# Patient Record
Sex: Male | Born: 1960 | Race: Black or African American | Hispanic: No | Marital: Married | Smoking: Former smoker
Health system: Southern US, Community
[De-identification: ages and names within clinical notes are randomized; demographics above are authoritative.]

## PROBLEM LIST (undated history)

## (undated) DIAGNOSIS — I10 Essential (primary) hypertension: Secondary | ICD-10-CM

## (undated) DIAGNOSIS — M1990 Unspecified osteoarthritis, unspecified site: Secondary | ICD-10-CM

## (undated) HISTORY — PX: HERNIA REPAIR: SHX51

## (undated) HISTORY — PX: OTHER SURGICAL HISTORY: SHX169

## (undated) HISTORY — PX: KNEE ARTHROSCOPY WITH MENISCAL REPAIR: SHX5653

---

## 2015-12-20 ENCOUNTER — Telehealth (HOSPITAL_COMMUNITY): Payer: Self-pay | Admitting: *Deleted

## 2015-12-20 ENCOUNTER — Ambulatory Visit (HOSPITAL_COMMUNITY)
Admission: EM | Admit: 2015-12-20 | Discharge: 2015-12-20 | Disposition: A | Discharge status: Home / Self Care | Payer: Worker's Compensation | Attending: Family Medicine | Admitting: Family Medicine

## 2015-12-20 ENCOUNTER — Ambulatory Visit (HOSPITAL_COMMUNITY): Payer: Worker's Compensation

## 2015-12-20 ENCOUNTER — Encounter (HOSPITAL_COMMUNITY): Payer: Self-pay | Admitting: *Deleted

## 2015-12-20 DIAGNOSIS — S46911A Strain of unspecified muscle, fascia and tendon at shoulder and upper arm level, right arm, initial encounter: Secondary | ICD-10-CM

## 2015-12-20 DIAGNOSIS — M25521 Pain in right elbow: Secondary | ICD-10-CM | POA: Insufficient documentation

## 2015-12-20 DIAGNOSIS — S56911A Strain of unspecified muscles, fascia and tendons at forearm level, right arm, initial encounter: Secondary | ICD-10-CM

## 2015-12-20 MED ORDER — IBUPROFEN 800 MG PO TABS
800.0000 mg | ORAL_TABLET | Freq: Once | ORAL | Status: AC
  Administered 2015-12-20: 800 mg via ORAL

## 2015-12-20 MED ORDER — IBUPROFEN 800 MG PO TABS
ORAL_TABLET | ORAL | Status: AC
Start: 2015-12-20 — End: 2015-12-20
  Filled 2015-12-20: qty 1

## 2015-12-20 MED ORDER — DICLOFENAC POTASSIUM 50 MG PO TABS: 50.0000 mg | ORAL_TABLET | Freq: Three times a day (TID) | ORAL | Status: DC

## 2015-12-20 NOTE — Discharge Instructions (Signed)
Ice pack, sling and medicine as needed, see orthopedist if further problems

## 2015-12-20 NOTE — ED Notes (Signed)
Reports sudden onset right arm pain while carrying an airplane seat at work 11/28/15.  Continues with pain.  Has not taken any measures to help alleviate pain.

## 2015-12-20 NOTE — ED Notes (Signed)
Pt transferred to main hospital XR dept.

## 2015-12-20 NOTE — ED Notes (Signed)
Awaiting for shuttle return to transport pt for XR.

## 2015-12-20 NOTE — ED Provider Notes (Signed)
CSN: [id number]     Arrival date & time 12/20/15  1402 History   First MD Initiated Contact with Patient 12/20/15 1430     Chief Complaint  Patient presents with  . Arm Pain   (Consider location/radiation/quality/duration/timing/severity/associated sxs/prior Treatment) Patient is a 55 y.o. male presenting with arm injury. The history is provided by the patient.  Arm Injury Location:  Elbow Time since incident:  3 weeks Injury: yes   Mechanism of injury comment:  Carrying an airplane chair backward and suddenly felt medial right elbow pain for 3 wks now with certain positions. Elbow location:  R elbow Pain details:    Quality:  Sharp   Severity:  Mild   Onset quality:  Sudden Chronicity:  New Handedness:  Right-handed Dislocation: no   Foreign body present:  No foreign bodies Associated symptoms: no neck pain     History reviewed. No pertinent past medical history. Past Surgical History  Procedure Laterality Date  . Hernia repair    . Knee arthroscopy with meniscal repair    . Right neck/wrist/hand     No family history on file. Social History  Substance Use Topics  . Smoking status: Former Games developermoker  . Smokeless tobacco: None  . Alcohol Use: Yes     Comment: occasional    Review of Systems  Constitutional: Negative.   Musculoskeletal: Positive for myalgias. Negative for joint swelling, gait problem and neck pain.  Skin: Negative.   All other systems reviewed and are negative.   Allergies  Review of patient's allergies indicates no known allergies.  Home Medications   Prior to Admission medications   Medication Sig Start Date End Date Taking? Authorizing Provider  UNKNOWN TO PATIENT OTC supplements/vitamins   Yes Historical Provider, MD  diclofenac (CATAFLAM) 50 MG tablet Take 1 tablet (50 mg total) by mouth 3 (three) times daily. 12/20/15   Linna HoffJames D Nicolina Hirt, MD   Meds Ordered and Administered this Visit   Medications  ibuprofen (ADVIL,MOTRIN) tablet 800 mg (800 mg  Oral Given 12/20/15 1625)    BP 144/93 mmHg  Pulse 69  Temp(Src) 98.2 F (36.8 C) (Oral)  SpO2 96% No data found.   Physical Exam  Constitutional: He is oriented to person, place, and time. He appears well-developed and well-nourished.  Musculoskeletal: He exhibits tenderness.       Right elbow: He exhibits normal range of motion. Tenderness found. Medial epicondyle tenderness noted.       Arms: Neurological: He is alert and oriented to person, place, and time.  Skin: Skin is warm and dry.  Nursing note and vitals reviewed.   ED Course  Procedures (including critical care time)  Labs Review Labs Reviewed - No data to display  Imaging Review Dg Elbow Complete Right  12/20/2015  CLINICAL DATA:  Right elbow pain following injury 1 month ago. Initial encounter. EXAM: RIGHT ELBOW - COMPLETE 3+ VIEW COMPARISON:  None. FINDINGS: There is no evidence of fracture, dislocation, or joint effusion. There is no evidence of arthropathy or other focal bone abnormality. Soft tissues are unremarkable. IMPRESSION: Negative. Electronically Signed   By: Harmon PierJeffrey  Hu M.D.   On: 12/20/2015 16:26     Visual Acuity Review  Right Eye Distance:   Left Eye Distance:   Bilateral Distance:    Right Eye Near:   Left Eye Near:    Bilateral Near:         MDM   1. Elbow strain, right, initial encounter  Linna Hoff, MD 12/20/15 [phone]

## 2017-04-23 IMAGING — DX DG ELBOW COMPLETE 3+V*R*
4 series · 4 of 4 positions shown · non-contrast
Comparison: None.

CLINICAL DATA: Right elbow pain following injury 1 month ago.
Initial encounter.

EXAM:
RIGHT ELBOW - COMPLETE 3+ VIEW

[x elbow obl right (1 of 2)]
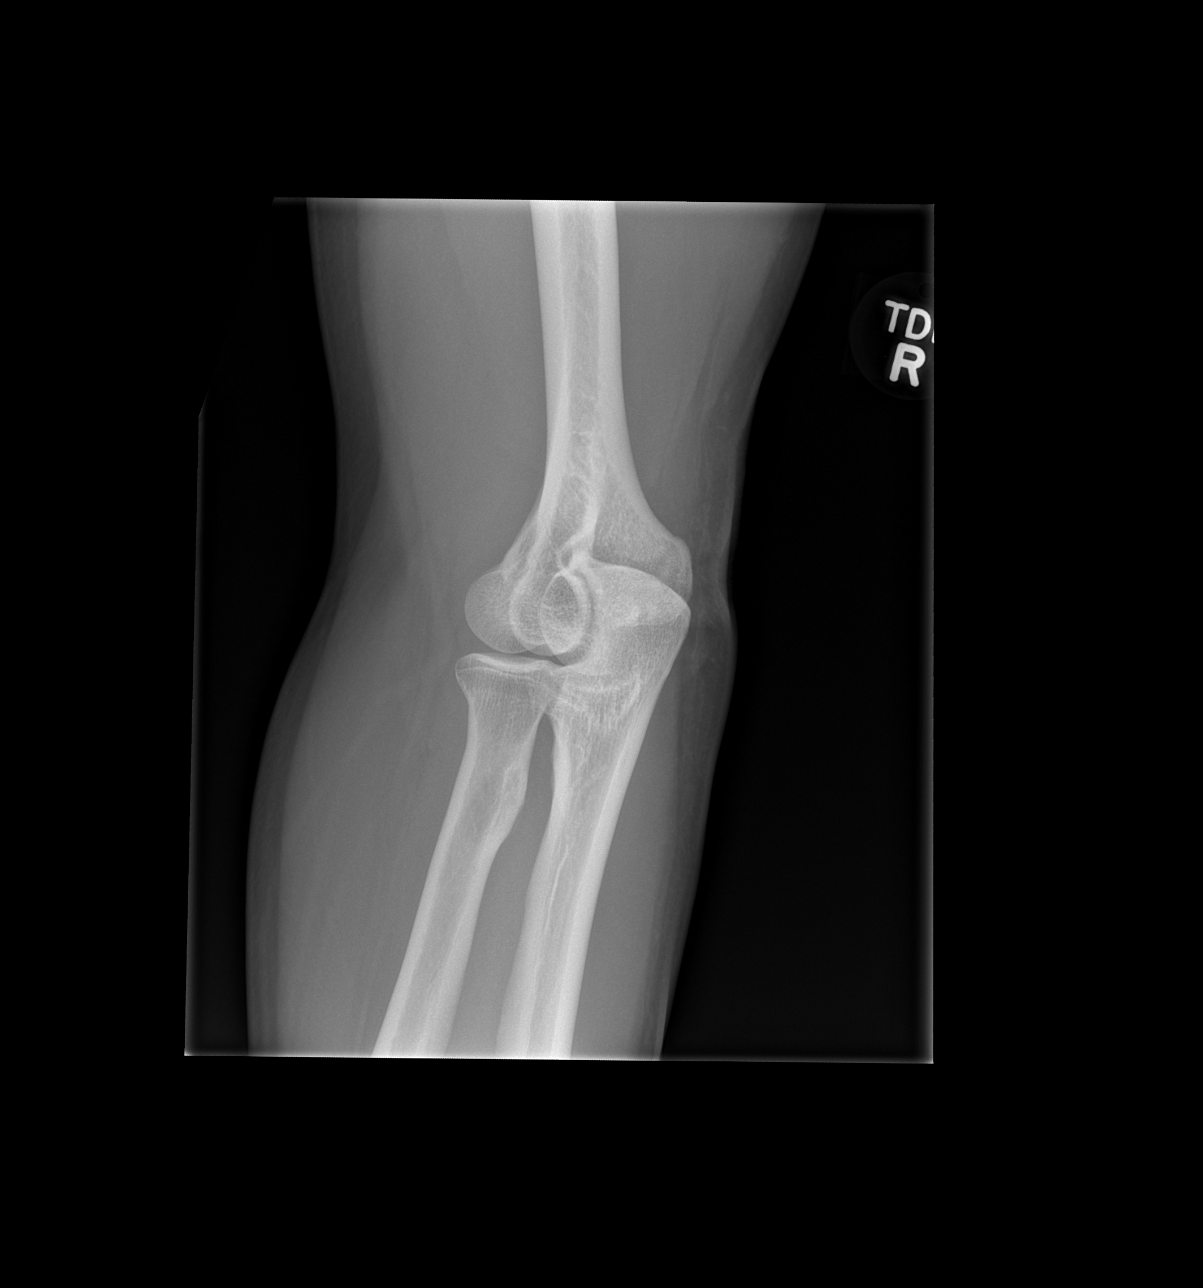

[x elbow ap right]
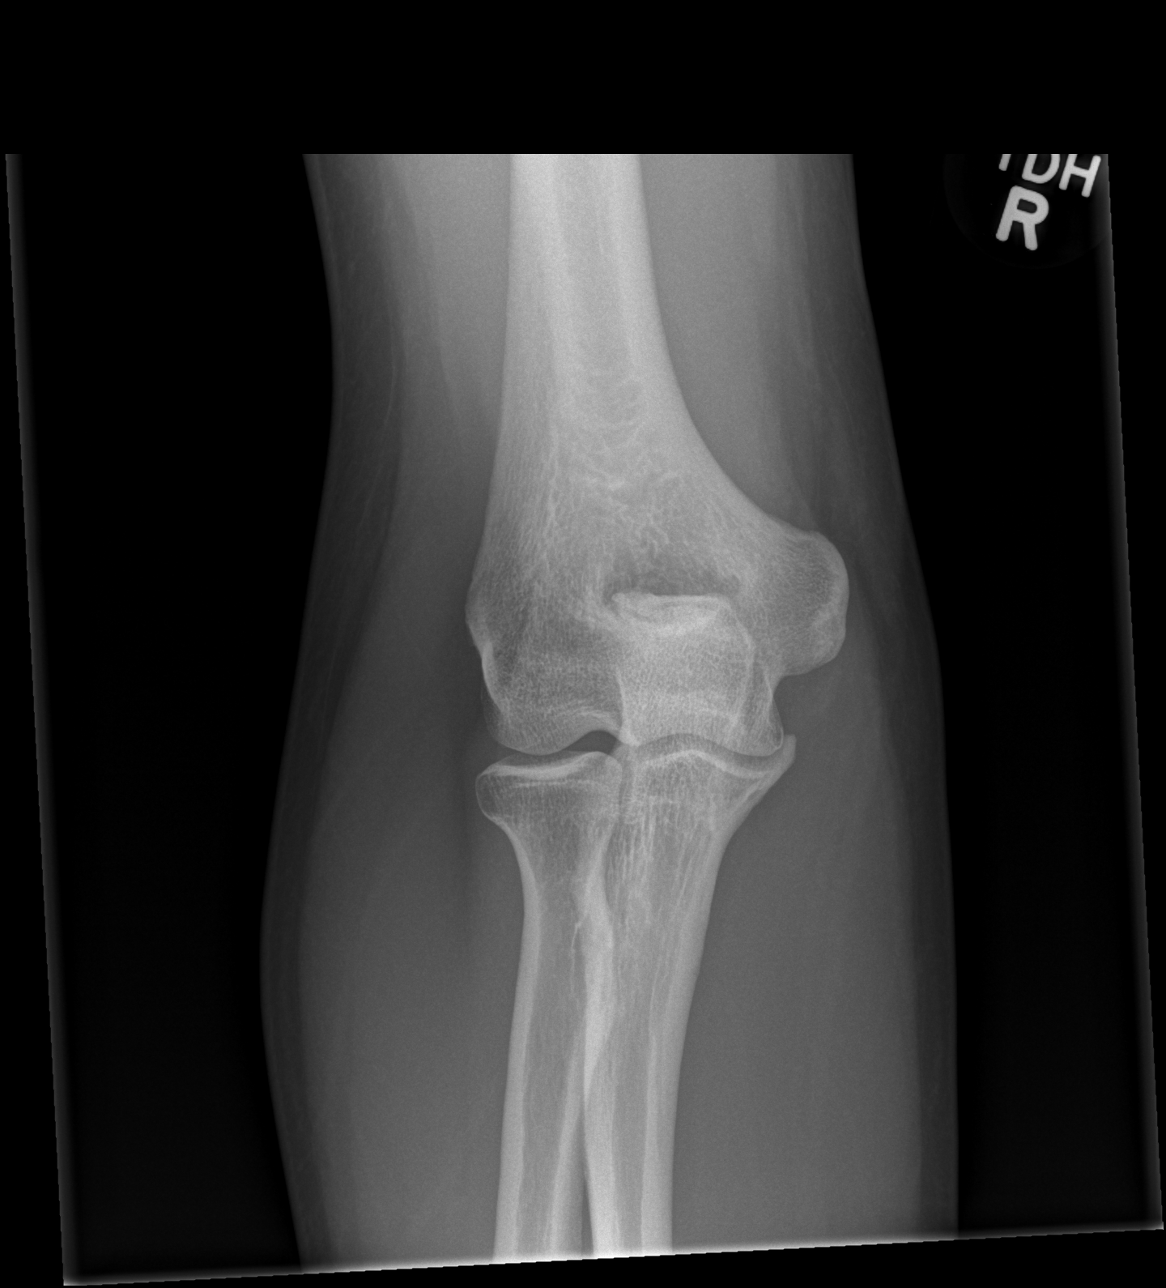

[x elbow obl right (2 of 2)]
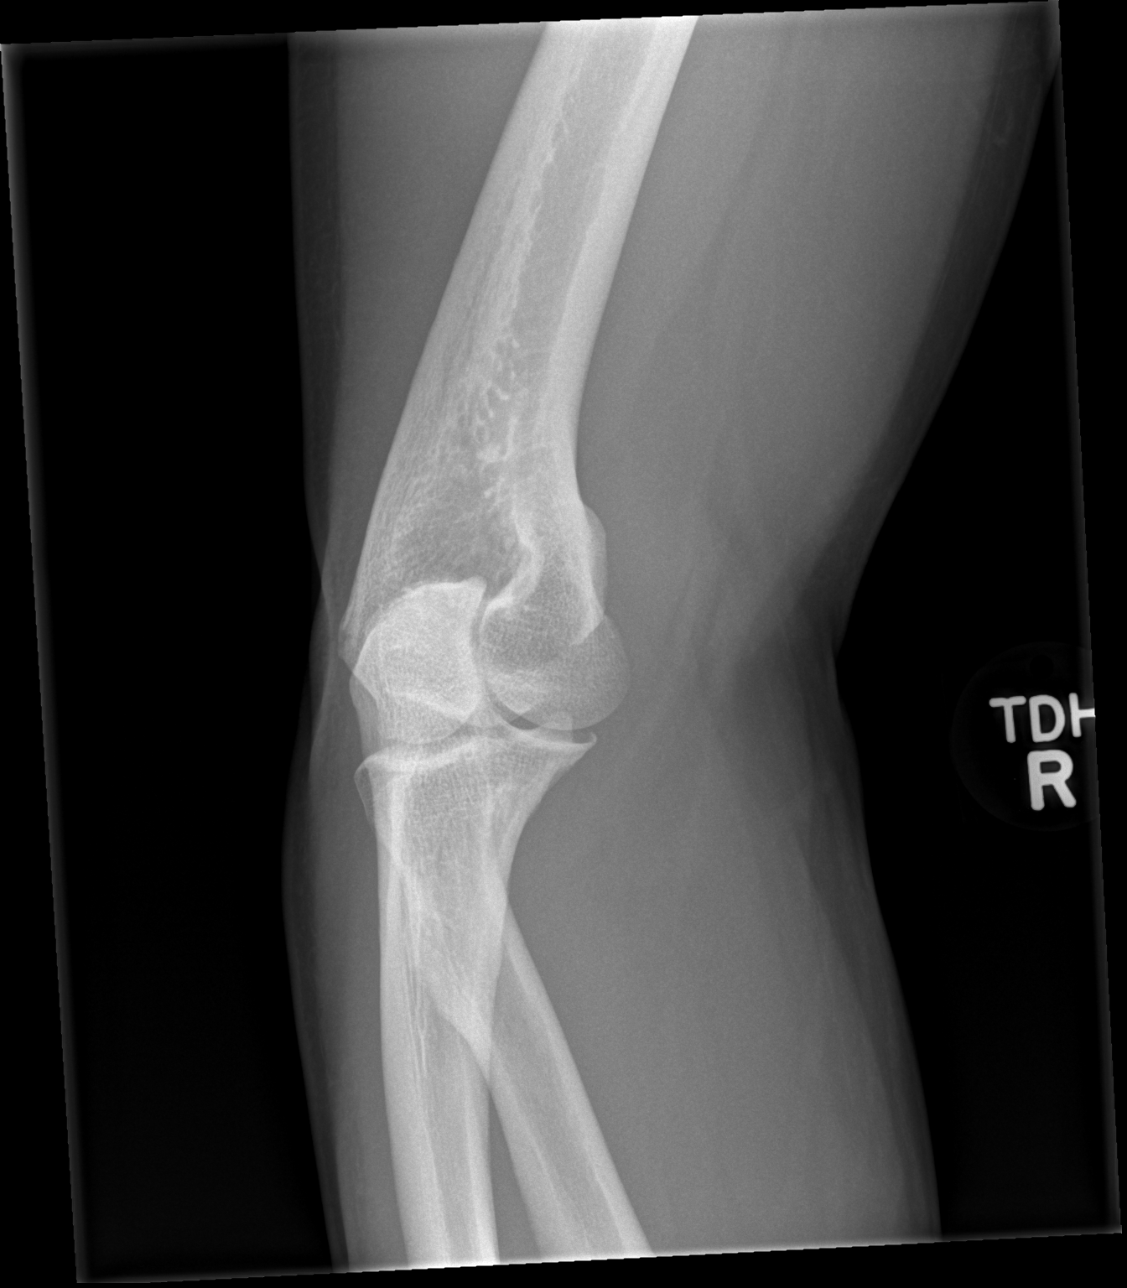

[x elbow lat right]
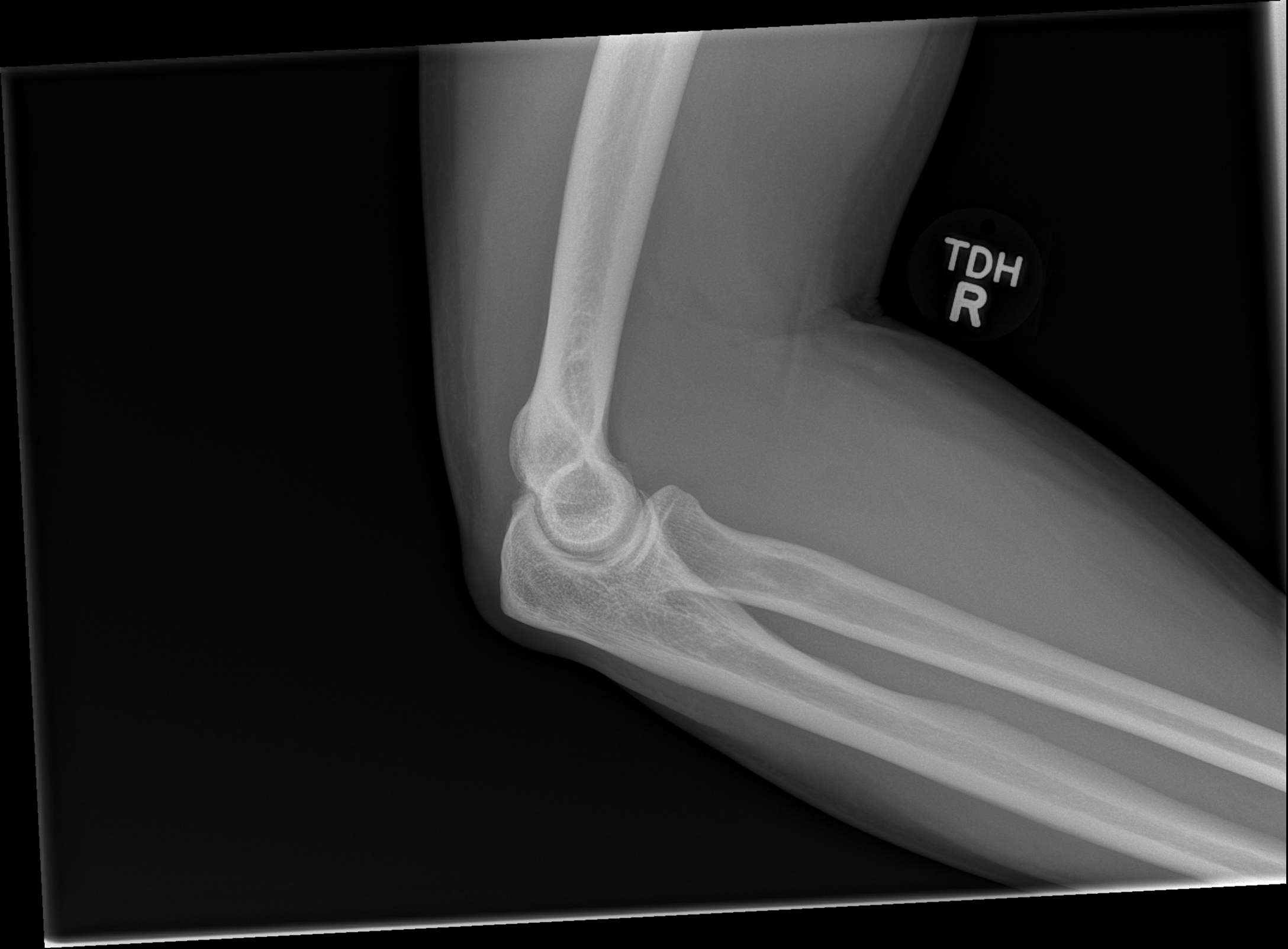

[4 of 4 positions shown; findings below may reference images not displayed]

FINDINGS: There is no evidence of fracture, dislocation, or joint effusion.
There is no evidence of arthropathy or other focal bone abnormality.
Soft tissues are unremarkable.
IMPRESSION: Negative.

## 2019-12-07 ENCOUNTER — Ambulatory Visit (HOSPITAL_COMMUNITY)
Admission: EM | Admit: 2019-12-07 | Discharge: 2019-12-07 | Disposition: A | Discharge status: Home / Self Care | Payer: Self-pay | Attending: Emergency Medicine | Admitting: Emergency Medicine

## 2019-12-07 ENCOUNTER — Other Ambulatory Visit: Payer: Self-pay

## 2019-12-07 ENCOUNTER — Encounter (HOSPITAL_COMMUNITY): Payer: Self-pay

## 2019-12-07 DIAGNOSIS — M778 Other enthesopathies, not elsewhere classified: Secondary | ICD-10-CM

## 2019-12-07 MED ORDER — ACETAMINOPHEN 500 MG PO TABS: 500.0000 mg | ORAL_TABLET | Freq: Four times a day (QID) | ORAL | 0 refills | Status: DC | PRN

## 2019-12-07 MED ORDER — PREDNISONE 10 MG (21) PO TBPK: ORAL_TABLET | ORAL | 0 refills | Status: DC

## 2019-12-07 NOTE — Discharge Instructions (Addendum)
Rest, ice and heat as needed Ensure adequate ROM as tolerated. Prescribed Tylenol as needed for pain relief  Prescribed prednisone taper for inflammation Return here or go to ER if you have any new or worsening symptoms .Marland Kitchen

## 2019-12-07 NOTE — ED Provider Notes (Signed)
[facility]    CSN: [id number] Arrival date & time: 12/07/19  1546      History   Chief Complaint Chief Complaint  Patient presents with  . Wrist Pain    HPI Javier Obrien is a 59 y.o. male.   Who presented to the urgent care with a complaint of left wrist pain that started yesterday.  Denies any precipitating event.  Localized pain to the left wrist.  He describes the pain as constant and achy, rated 6 on a scale 1-10.Marland Kitchen  He has tried OTC medications without relief.  His symptoms are made worse with ROM.  He denies similar symptoms in the past.  Denies chills, fever, nausea, vomiting, diarrhea, confusion, trauma, injury.    The history is provided by the patient. No language interpreter was used.  Wrist Pain    History reviewed. No pertinent past medical history.  There are no problems to display for this patient.   Past Surgical History:  Procedure Laterality Date  . HERNIA REPAIR    . KNEE ARTHROSCOPY WITH MENISCAL REPAIR    . Right neck/wrist/hand         Home Medications    Prior to Admission medications   Medication Sig Start Date End Date Taking? Authorizing Provider  acetaminophen (TYLENOL) 500 MG tablet Take 1 tablet (500 mg total) by mouth every 6 (six) hours as needed. 12/07/19   Sederick Jacobsen, Zachery Dakins, FNP  diclofenac (CATAFLAM) 50 MG tablet Take 1 tablet (50 mg total) by mouth 3 (three) times daily. 12/20/15   Linna Hoff, MD  predniSONE (STERAPRED UNI-PAK 21 TAB) 10 MG (21) TBPK tablet Take 6 tabs by mouth daily  for 2 days, then 5 tabs for 2 days, then 4 tabs for 2 days, then 3 tabs for 2 days, 2 tabs for 2 days, then 1 tab by mouth daily for 2 days 12/07/19   Durward Parcel, FNP  UNKNOWN TO PATIENT OTC supplements/vitamins    [provider]    Family History Family History  Problem Relation Age of Onset  . COPD Father     Social History Social History   Tobacco Use  . Smoking status: Former Smoker  Substance Use Topics    . Alcohol use: Yes    Comment: occasional  . Drug use: No     Allergies   Patient has no known allergies.   Review of Systems Review of Systems  Constitutional: Negative.   Respiratory: Negative.   Musculoskeletal: Positive for arthralgias.  All other systems reviewed and are negative.    Physical Exam Triage Vital Signs ED Triage Vitals  Enc Vitals Group     BP 12/07/19 1613 (!) 164/101     Pulse Rate 12/07/19 1613 69     Resp 12/07/19 1613 16     Temp 12/07/19 1613 98.1 F (36.7 C)     Temp Source 12/07/19 1613 Oral     SpO2 12/07/19 1613 97 %     Weight --      Height --      Head Circumference --      Peak Flow --      Pain Score 12/07/19 1624 8     Pain Loc --      Pain Edu? --      Excl. in GC? --    No data found.  Updated Vital Signs BP (!) 175/105 (BP Location: Right Arm)   Pulse 69   Temp 98.1 F (36.7  C) (Oral)   Resp 16   SpO2 97%   Visual Acuity Right Eye Distance:   Left Eye Distance:   Bilateral Distance:    Right Eye Near:   Left Eye Near:    Bilateral Near:     Physical Exam Vitals and nursing note reviewed.  Constitutional:      General: He is not in acute distress.    Appearance: Normal appearance. He is normal weight. He is not ill-appearing, toxic-appearing or diaphoretic.  Cardiovascular:     Rate and Rhythm: Normal rate and regular rhythm.     Pulses: Normal pulses.     Heart sounds: Normal heart sounds. No murmur. No friction rub. No gallop.   Pulmonary:     Effort: Pulmonary effort is normal. No respiratory distress.     Breath sounds: Normal breath sounds. No stridor. No wheezing, rhonchi or rales.  Chest:     Chest wall: No tenderness.  Musculoskeletal:        General: Tenderness present. Normal range of motion.     Right wrist: Normal.     Left wrist: Tenderness present. No swelling, effusion or lacerations.     Comments: The left wrist is without any obvious asymmetry or deformity when compared to the right.   There is no surface trauma, ecchymosis, warmth present.  Patient is able to flex, extend, invert/evert with pain.  Neurovascular status intact.  Neurological:     Mental Status: He is alert.      UC Treatments / Results  Labs (all labs ordered are listed, but only abnormal results are displayed) Labs Reviewed - No data to display  EKG   Radiology No results found.  Procedures Procedures (including critical care time)  Medications Ordered in UC Medications - No data to display  Initial Impression / Assessment and Plan / UC Course  I have reviewed the triage vital signs and the nursing notes.  Pertinent labs & imaging results that were available during my care of the patient were reviewed by me and considered in my medical decision making (see chart for details).     Patient is stable at discharge.  His symptom is likely from wrist tendinitis.  Will prescribed Tylenol and prednisone for pain management.  Work note was given Final Clinical Impressions(s) / UC Diagnoses   Final diagnoses:  Tendinitis of left wrist     Discharge Instructions     Rest, ice and heat as needed Ensure adequate ROM as tolerated. Prescribed Tylenol as needed for pain relief  Prescribed prednisone taper for inflammation Return here or go to ER if you have any new or worsening symptoms .Marland Kitchen      ED Prescriptions    Medication Sig Dispense Auth. Provider   predniSONE (STERAPRED UNI-PAK 21 TAB) 10 MG (21) TBPK tablet Take 6 tabs by mouth daily  for 2 days, then 5 tabs for 2 days, then 4 tabs for 2 days, then 3 tabs for 2 days, 2 tabs for 2 days, then 1 tab by mouth daily for 2 days 42 tablet Nyrie Sigal, Darrelyn Hillock, FNP   acetaminophen (TYLENOL) 500 MG tablet Take 1 tablet (500 mg total) by mouth every 6 (six) hours as needed. 30 tablet Jusiah Aguayo, Darrelyn Hillock, FNP     PDMP not reviewed this encounter.   Emerson Monte, FNP 12/07/19 1708

## 2019-12-07 NOTE — ED Triage Notes (Signed)
Pt c/o acute onset left wrist pain yesterday.  Pt states while turning his hand/wrist, his wrist started hurting.  Denies hitting/falling on wrist.  Pt applied his own wrist support and states it has helped the pain somewhat; took Advil yesterday.

## 2022-01-06 ENCOUNTER — Ambulatory Visit (INDEPENDENT_AMBULATORY_CARE_PROVIDER_SITE_OTHER): Payer: BC Managed Care – PPO | Admitting: Podiatry

## 2022-01-06 DIAGNOSIS — L988 Other specified disorders of the skin and subcutaneous tissue: Secondary | ICD-10-CM

## 2022-01-06 DIAGNOSIS — B999 Unspecified infectious disease: Secondary | ICD-10-CM | POA: Diagnosis not present

## 2022-01-20 ENCOUNTER — Ambulatory Visit (INDEPENDENT_AMBULATORY_CARE_PROVIDER_SITE_OTHER): Payer: BC Managed Care – PPO | Admitting: Podiatry

## 2022-01-20 ENCOUNTER — Ambulatory Visit: Payer: Self-pay | Admitting: Podiatry

## 2022-01-20 ENCOUNTER — Encounter: Payer: Self-pay | Admitting: General Practice

## 2022-01-20 ENCOUNTER — Ambulatory Visit: Payer: BC Managed Care – PPO | Admitting: Podiatry

## 2022-01-20 DIAGNOSIS — Q666 Other congenital valgus deformities of feet: Secondary | ICD-10-CM

## 2022-01-20 DIAGNOSIS — B999 Unspecified infectious disease: Secondary | ICD-10-CM

## 2022-01-20 DIAGNOSIS — L988 Other specified disorders of the skin and subcutaneous tissue: Secondary | ICD-10-CM

## 2022-01-20 DIAGNOSIS — M7752 Other enthesopathy of left foot: Secondary | ICD-10-CM | POA: Diagnosis not present

## 2022-01-20 NOTE — Progress Notes (Signed)
Subjective:  Patient ID: Javier Obrien, male    DOB: 1960/07/26,  MRN: [id number]  Chief Complaint  Patient presents with   Callouses     Callus pain    61 y.o. male presents with the above complaint.  Patient presents with complaint left fourth and fifth digit superinfection.  Patient has been applying Betadine wet-to-dry dressing seems to help a little bit however he states it still hurts has progressive gotten worse.  He wanted to get it evaluated he denies any other acute complaints.  He would like to know if he can do a steroid shot.   Review of Systems: Negative except as noted in the HPI. Denies N/V/F/Ch.  No past medical history on file.  Current Outpatient Medications:    acetaminophen (TYLENOL) 500 MG tablet, Take 1 tablet (500 mg total) by mouth every 6 (six) hours as needed., Disp: 30 tablet, Rfl: 0   diclofenac (CATAFLAM) 50 MG tablet, Take 1 tablet (50 mg total) by mouth 3 (three) times daily., Disp: 30 tablet, Rfl: 0   losartan-hydrochlorothiazide (HYZAAR) 50-12.5 MG tablet, losartan 50 mg-hydrochlorothiazide 12.5 mg tablet  Take 1 tablet every day by oral route with meals for 90 days., Disp: , Rfl:    naproxen (NAPROSYN) 500 MG tablet, naproxen 500 mg tablet  Take 1 tablet twice a day by oral route for 10 days., Disp: , Rfl:    predniSONE (STERAPRED UNI-PAK 21 TAB) 10 MG (21) TBPK tablet, Take 6 tabs by mouth daily  for 2 days, then 5 tabs for 2 days, then 4 tabs for 2 days, then 3 tabs for 2 days, 2 tabs for 2 days, then 1 tab by mouth daily for 2 days, Disp: 42 tablet, Rfl: 0   UNKNOWN TO PATIENT, OTC supplements/vitamins, Disp: , Rfl:   Social History   Tobacco Use  Smoking Status Former  Smokeless Tobacco Not on file    No Known Allergies Objective:  There were no vitals filed for this visit. There is no height or weight on file to calculate BMI. Constitutional Well developed. Well nourished.  Vascular Dorsalis pedis pulses palpable bilaterally. Posterior  tibial pulses palpable bilaterally. Capillary refill normal to all digits.  No cyanosis or clubbing noted. Pedal hair growth normal.  Neurologic Normal speech. Oriented to person, place, and time. Epicritic sensation to light touch grossly present bilaterally.  Dermatologic Macerated skin noted between fourth and fifth digit with signs consistent with superinfection.  Orthopedic: Normal joint ROM without pain or crepitus bilaterally. No visible deformities. No bony tenderness.   Radiographs: None Assessment:   1. Superinfection   2. Maceration of skin   3. Pes planovalgus   4. Capsulitis of toe of left foot     Plan:  Patient was evaluated and treated and all questions answered.  Left fifth digit capsulitis -I explained the patient the etiology of capsulitis versus treatment options were discussed given the amount of pain that he is having he will benefit from steroid injection -A steroid injection was performed at left fifth PIPJ joint using 1% plain Lidocaine and 10 mg of Kenalog. This was well tolerated.   Left fourth and fifth digit macerated skin/superinfection -All questions and concerns were discussed with the patient in extensive detail -Continue Betadine wet-to-dry dressing.  I discussed shoe gear modification in extensive detail.  He states he will obtain extrawide shoes.  He also has underlying flatfoot deformity for which I discussed orthotics management as well  Pes planovalgus -I explained to patient the  etiology of pes planovalgus and relationship with Planter fasciitis/arch heel support and various treatment options were discussed.  Given patient foot structure in the setting of Planter fasciitis/arch she will support I believe patient will benefit from custom-made orthotics to help control the hindfoot motion support the arch of the foot and take the stress away from plantar fascial.  Patient agrees with the plan like to proceed with orthotics -Patient was casted  for orthotics   No follow-ups on file.

## 2022-03-03 ENCOUNTER — Ambulatory Visit (INDEPENDENT_AMBULATORY_CARE_PROVIDER_SITE_OTHER): Payer: BC Managed Care – PPO | Admitting: Podiatry

## 2022-03-03 ENCOUNTER — Ambulatory Visit: Payer: BC Managed Care – PPO | Admitting: Podiatry

## 2022-03-03 DIAGNOSIS — Q666 Other congenital valgus deformities of feet: Secondary | ICD-10-CM

## 2022-03-03 DIAGNOSIS — B999 Unspecified infectious disease: Secondary | ICD-10-CM

## 2022-03-03 DIAGNOSIS — L988 Other specified disorders of the skin and subcutaneous tissue: Secondary | ICD-10-CM

## 2022-03-03 NOTE — Progress Notes (Signed)
Subjective:  Patient ID: Javier Obrien, male    DOB: 21-Oct-1960,  MRN: [id number]  Chief Complaint  Patient presents with   Callouses    painful callous between 4th and 5th toe on left foot. Patient stated it is better and healing.     61 y.o. male presents with the above complaint.  Patient presents for follow-up of left fourth and fifth digit superinfection.  He states is doing a lot better the Betadine helped considerably.  He states that everything looks good the shoe gear modification helped considerably.  He is here to pick up his orthotics.   Review of Systems: Negative except as noted in the HPI. Denies N/V/F/Ch.  No past medical history on file.  Current Outpatient Medications:    acetaminophen (TYLENOL) 500 MG tablet, Take 1 tablet (500 mg total) by mouth every 6 (six) hours as needed., Disp: 30 tablet, Rfl: 0   diclofenac (CATAFLAM) 50 MG tablet, Take 1 tablet (50 mg total) by mouth 3 (three) times daily., Disp: 30 tablet, Rfl: 0   losartan-hydrochlorothiazide (HYZAAR) 50-12.5 MG tablet, losartan 50 mg-hydrochlorothiazide 12.5 mg tablet  Take 1 tablet every day by oral route with meals for 90 days., Disp: , Rfl:    naproxen (NAPROSYN) 500 MG tablet, naproxen 500 mg tablet  Take 1 tablet twice a day by oral route for 10 days., Disp: , Rfl:    predniSONE (STERAPRED UNI-PAK 21 TAB) 10 MG (21) TBPK tablet, Take 6 tabs by mouth daily  for 2 days, then 5 tabs for 2 days, then 4 tabs for 2 days, then 3 tabs for 2 days, 2 tabs for 2 days, then 1 tab by mouth daily for 2 days, Disp: 42 tablet, Rfl: 0   UNKNOWN TO PATIENT, OTC supplements/vitamins, Disp: , Rfl:   Social History   Tobacco Use  Smoking Status Former  Smokeless Tobacco Not on file    No Known Allergies Objective:  There were no vitals filed for this visit. There is no height or weight on file to calculate BMI. Constitutional Well developed. Well nourished.  Vascular Dorsalis pedis pulses palpable  bilaterally. Posterior tibial pulses palpable bilaterally. Capillary refill normal to all digits.  No cyanosis or clubbing noted. Pedal hair growth normal.  Neurologic Normal speech. Oriented to person, place, and time. Epicritic sensation to light touch grossly present bilaterally.  Dermatologic No further Macerated skin noted between fourth and fifth digit without signs  with superinfection.  Clinically resolved skin has closed off  Orthopedic: Normal joint ROM without pain or crepitus bilaterally. No visible deformities. No bony tenderness.   Radiographs: None Assessment:   1. Superinfection   2. Maceration of skin   3. Pes planovalgus      Plan:  Patient was evaluated and treated and all questions answered.  Left fifth digit capsulitis -Healed   Left fourth and fifth digit macerated skin/superinfection -Clinically healed.  At this time continue discussed patient maintaining dry and shoe gear modification orthotics he states understanding.  Clean  Pes planovalgus -I explained to patient the etiology of pes planovalgus and relationship with Planter fasciitis/arch heel support and various treatment options were discussed.  Given patient foot structure in the setting of Planter fasciitis/arch she will support I believe patient will benefit from custom-made orthotics to help control the hindfoot motion support the arch of the foot and take the stress away from plantar fascial.  Patient agrees with the plan like to proceed with orthotics -Orthotics were dispensed.  They are  functioning well.   No follow-ups on file.

## 2023-08-04 ENCOUNTER — Ambulatory Visit (INDEPENDENT_AMBULATORY_CARE_PROVIDER_SITE_OTHER): Payer: BC Managed Care – PPO

## 2023-08-04 ENCOUNTER — Ambulatory Visit
Admission: EM | Admit: 2023-08-04 | Discharge: 2023-08-04 | Disposition: A | Discharge status: Home / Self Care | Payer: BC Managed Care – PPO | Attending: Family Medicine | Admitting: Family Medicine

## 2023-08-04 DIAGNOSIS — R051 Acute cough: Secondary | ICD-10-CM | POA: Diagnosis not present

## 2023-08-04 DIAGNOSIS — J209 Acute bronchitis, unspecified: Secondary | ICD-10-CM | POA: Diagnosis not present

## 2023-08-04 MED ORDER — ALBUTEROL SULFATE HFA 108 (90 BASE) MCG/ACT IN AERS: 1.0000 | INHALATION_SPRAY | Freq: Four times a day (QID) | RESPIRATORY_TRACT | 0 refills | Status: DC | PRN

## 2023-08-04 MED ORDER — AMOXICILLIN-POT CLAVULANATE 875-125 MG PO TABS: 1.0000 | ORAL_TABLET | Freq: Two times a day (BID) | ORAL | 0 refills | Status: DC

## 2023-08-04 MED ORDER — PROMETHAZINE-DM 6.25-15 MG/5ML PO SYRP: 5.0000 mL | ORAL_SOLUTION | Freq: Every evening | ORAL | 0 refills | Status: DC | PRN

## 2023-08-04 NOTE — ED Triage Notes (Signed)
Pt presents with cough X 1 wk  Pt states he is feeling much better today but is not able to recover from chest congestion.   Pt states he has taken Mucinex and Tylenol

## 2023-08-04 NOTE — Discharge Instructions (Signed)
You may take Promethazine DM at night as needed for cough.  Please of this medication can make you drowsy.  Do not drink alcohol or drive while on this medication.  Albuterol inhaler as needed for wheezing or shortness of breath.  Lots of rest and fluids.  Provisional prescription for Augmentin has been provided.  Please do not take unless your symptoms do not improve or worsen over the next 3 to 4 days.  Please follow-up with your PCP in 2 days for recheck.  I hope you feel better soon!

## 2023-08-04 NOTE — ED Provider Notes (Addendum)
[facility]    CSN: [id number] Arrival date & time: 08/04/23  0804      History   Chief Complaint Chief Complaint  Patient presents with   Cough    HPI Javier Obrien is a 63 y.o. male  presents for evaluation of URI symptoms for 7 days. Patient reports associated symptoms of previously productive cough but is now dry and some wheezing. Denies N/V/D, fevers, ear pain, sore throat, body aches, shortness of breath. Patient does not have a hx of asthma. Patient is a previous smoker.  States he had a negative home COVID test.  Reports wife has GI symptoms but no respiratory symptoms.  Pt has taken Mucinex and Tylenol OTC for symptoms.  He states he overall feels better but is not able to get anything up when he coughs.  Pt has no other concerns at this time.    Cough Associated symptoms: wheezing     History reviewed. No pertinent past medical history.  There are no active problems to display for this patient.   Past Surgical History:  Procedure Laterality Date   HERNIA REPAIR     KNEE ARTHROSCOPY WITH MENISCAL REPAIR     Right neck/wrist/hand         Home Medications    Prior to Admission medications   Medication Sig Start Date End Date Taking? Authorizing Provider  albuterol (VENTOLIN HFA) 108 (90 Base) MCG/ACT inhaler Inhale 1-2 puffs into the lungs every 6 (six) hours as needed. 08/04/23  Yes Radford Pax, NP  amoxicillin-clavulanate (AUGMENTIN) 875-125 MG tablet Take 1 tablet by mouth every 12 (twelve) hours. 08/08/23  Yes Radford Pax, NP  promethazine-dextromethorphan (PROMETHAZINE-DM) 6.25-15 MG/5ML syrup Take 5 mLs by mouth at bedtime as needed for cough. 08/04/23  Yes Radford Pax, NP  acetaminophen (TYLENOL) 500 MG tablet Take 1 tablet (500 mg total) by mouth every 6 (six) hours as needed. 12/07/19   Avegno, Zachery Dakins, FNP  diclofenac (CATAFLAM) 50 MG tablet Take 1 tablet (50 mg total) by mouth 3 (three) times daily. 12/20/15   Linna Hoff, MD   losartan-hydrochlorothiazide (HYZAAR) 50-12.5 MG tablet losartan 50 mg-hydrochlorothiazide 12.5 mg tablet  Take 1 tablet every day by oral route with meals for 90 days.    [provider]  naproxen (NAPROSYN) 500 MG tablet naproxen 500 mg tablet  Take 1 tablet twice a day by oral route for 10 days.    [provider]  predniSONE (STERAPRED UNI-PAK 21 TAB) 10 MG (21) TBPK tablet Take 6 tabs by mouth daily  for 2 days, then 5 tabs for 2 days, then 4 tabs for 2 days, then 3 tabs for 2 days, 2 tabs for 2 days, then 1 tab by mouth daily for 2 days 12/07/19   Durward Parcel, FNP  UNKNOWN TO PATIENT OTC supplements/vitamins    [provider]    Family History Family History  Problem Relation Age of Onset   COPD Father     Social History Social History   Tobacco Use   Smoking status: Former  Substance Use Topics   Alcohol use: Yes    Comment: occasional   Drug use: No     Allergies   Patient has no known allergies.   Review of Systems Review of Systems  Respiratory:  Positive for cough and wheezing.      Physical Exam Triage Vital Signs ED Triage Vitals  Encounter Vitals Group     BP 08/04/23  4401 129/86     Systolic BP Percentile --      Diastolic BP Percentile --      Pulse Rate 08/04/23 0814 (!) 59     Resp 08/04/23 0814 17     Temp 08/04/23 0814 98.4 F (36.9 C)     Temp Source 08/04/23 0814 Oral     SpO2 08/04/23 0814 96 %     Weight --      Height --      Head Circumference --      Peak Flow --      Pain Score 08/04/23 0813 0     Pain Loc --      Pain Education --      Exclude from Growth Chart --    No data found.  Updated Vital Signs BP 129/86 (BP Location: Right Arm)   Pulse (!) 59   Temp 98.4 F (36.9 C) (Oral)   Resp 17   SpO2 96%   Visual Acuity Right Eye Distance:   Left Eye Distance:   Bilateral Distance:    Right Eye Near:   Left Eye Near:    Bilateral Near:     Physical Exam Vitals and nursing note  reviewed.  Constitutional:      General: He is not in acute distress.    Appearance: Normal appearance. He is not ill-appearing or toxic-appearing.  HENT:     Head: Normocephalic and atraumatic.     Right Ear: Tympanic membrane and ear canal normal.     Left Ear: Tympanic membrane and ear canal normal.     Nose: No congestion or rhinorrhea.     Mouth/Throat:     Mouth: Mucous membranes are moist.     Pharynx: No posterior oropharyngeal erythema.  Eyes:     Pupils: Pupils are equal, round, and reactive to light.  Cardiovascular:     Rate and Rhythm: Regular rhythm. Bradycardia present.     Heart sounds: Normal heart sounds.     Comments: Mildly bradycardia at a heart rate of 59 Pulmonary:     Effort: Pulmonary effort is normal.     Breath sounds: Normal breath sounds. No stridor. No rhonchi or rales.     Comments: Very mild exp wheezing bilateral upper lobes Musculoskeletal:     Cervical back: Normal range of motion and neck supple.  Lymphadenopathy:     Cervical: No cervical adenopathy.  Skin:    General: Skin is warm and dry.  Neurological:     General: No focal deficit present.     Mental Status: He is alert and oriented to person, place, and time.  Psychiatric:        Mood and Affect: Mood normal.        Behavior: Behavior normal.      UC Treatments / Results  Labs (all labs ordered are listed, but only abnormal results are displayed) Labs Reviewed - No data to display  EKG   Radiology No results found.  Procedures Procedures (including critical care time)  Medications Ordered in UC Medications - No data to display  Initial Impression / Assessment and Plan / UC Course  I have reviewed the triage vital signs and the nursing notes.  Pertinent labs & imaging results that were available during my care of the patient were reviewed by me and considered in my medical decision making (see chart for details).     Reviewed exam and symptoms with patient.  No red  flags.  Wet read of x-ray without obvious consolidation, will contact for any positive results based on radiology overread.  Discussed bronchitis.  Start albuterol Promethazine DM.  Provisional prescription for Augmentin provided with instruction not to take unless symptoms do not improve or worsen over the next 3 to 4 days and he verbalized understanding.  Follow-up with PCP 2 days for recheck.  Strict ER precautions reviewed and patient verbalized understanding. Final Clinical Impressions(s) / UC Diagnoses   Final diagnoses:  Acute cough  Acute bronchitis, unspecified organism     Discharge Instructions      You may take Promethazine DM at night as needed for cough.  Please of this medication can make you drowsy.  Do not drink alcohol or drive while on this medication.  Albuterol inhaler as needed for wheezing or shortness of breath.  Lots of rest and fluids.  Provisional prescription for Augmentin has been provided.  Please do not take unless your symptoms do not improve or worsen over the next 3 to 4 days.  Please follow-up with your PCP in 2 days for recheck.  I hope you feel better soon!     ED Prescriptions     Medication Sig Dispense Auth. Provider   promethazine-dextromethorphan (PROMETHAZINE-DM) 6.25-15 MG/5ML syrup Take 5 mLs by mouth at bedtime as needed for cough. 118 mL Radford Pax, NP   albuterol (VENTOLIN HFA) 108 (90 Base) MCG/ACT inhaler Inhale 1-2 puffs into the lungs every 6 (six) hours as needed. 1 each Radford Pax, NP   amoxicillin-clavulanate (AUGMENTIN) 875-125 MG tablet Take 1 tablet by mouth every 12 (twelve) hours. 14 tablet Radford Pax, NP      PDMP not reviewed this encounter.   Radford Pax, NP 08/04/23 0900    Radford Pax, NP 08/04/23 [phone]

## 2024-03-27 ENCOUNTER — Encounter (HOSPITAL_COMMUNITY): Payer: Self-pay | Admitting: *Deleted

## 2024-03-27 ENCOUNTER — Ambulatory Visit (HOSPITAL_COMMUNITY)
Admission: EM | Admit: 2024-03-27 | Discharge: 2024-03-27 | Disposition: A | Discharge status: Home / Self Care | Attending: Internal Medicine | Admitting: Internal Medicine

## 2024-03-27 ENCOUNTER — Ambulatory Visit (HOSPITAL_COMMUNITY): Payer: Self-pay | Admitting: Internal Medicine

## 2024-03-27 ENCOUNTER — Ambulatory Visit (INDEPENDENT_AMBULATORY_CARE_PROVIDER_SITE_OTHER)

## 2024-03-27 DIAGNOSIS — S62661B Nondisplaced fracture of distal phalanx of left index finger, initial encounter for open fracture: Secondary | ICD-10-CM | POA: Diagnosis not present

## 2024-03-27 DIAGNOSIS — S62639B Displaced fracture of distal phalanx of unspecified finger, initial encounter for open fracture: Secondary | ICD-10-CM

## 2024-03-27 DIAGNOSIS — W298XXA Contact with other powered powered hand tools and household machinery, initial encounter: Secondary | ICD-10-CM | POA: Diagnosis not present

## 2024-03-27 DIAGNOSIS — Z79899 Other long term (current) drug therapy: Secondary | ICD-10-CM | POA: Insufficient documentation

## 2024-03-27 DIAGNOSIS — I10 Essential (primary) hypertension: Secondary | ICD-10-CM | POA: Insufficient documentation

## 2024-03-27 DIAGNOSIS — Z23 Encounter for immunization: Secondary | ICD-10-CM | POA: Diagnosis not present

## 2024-03-27 DIAGNOSIS — S61211A Laceration without foreign body of left index finger without damage to nail, initial encounter: Secondary | ICD-10-CM | POA: Diagnosis present

## 2024-03-27 DIAGNOSIS — M25512 Pain in left shoulder: Secondary | ICD-10-CM | POA: Diagnosis present

## 2024-03-27 HISTORY — DX: Essential (primary) hypertension: I10

## 2024-03-27 LAB — COMPREHENSIVE METABOLIC PANEL WITH GFR
ALT: 24 U/L (ref 0–44)
AST: 27 U/L (ref 15–41)
Albumin: 4 g/dL (ref 3.5–5.0)
Alkaline Phosphatase: 48 U/L (ref 38–126)
Anion gap: 10 (ref 5–15)
BUN: 8 mg/dL (ref 8–23)
CO2: 27 mmol/L (ref 22–32)
Calcium: 9.3 mg/dL (ref 8.9–10.3)
Chloride: 102 mmol/L (ref 98–111)
Creatinine, Ser: 0.92 mg/dL (ref 0.61–1.24)
GFR, Estimated: >60 mL/min (ref >60)
Glucose, Bld: 89 mg/dL (ref 70–99)
Potassium: 4 mmol/L (ref 3.5–5.1)
Sodium: 139 mmol/L (ref 135–145)
Total Bilirubin: 0.8 mg/dL (ref 0.0–1.2)
Total Protein: 7.6 g/dL (ref 6.5–8.1)

## 2024-03-27 MED ORDER — HYDROCHLOROTHIAZIDE 12.5 MG PO TABS: 12.5000 mg | ORAL_TABLET | Freq: Every day | ORAL | 0 refills | Status: AC

## 2024-03-27 MED ORDER — AMLODIPINE BESYLATE 5 MG PO TABS: 5.0000 mg | ORAL_TABLET | Freq: Every day | ORAL | 0 refills | Status: AC

## 2024-03-27 MED ORDER — OLMESARTAN MEDOXOMIL 40 MG PO TABS: 40.0000 mg | ORAL_TABLET | Freq: Every day | ORAL | 0 refills | Status: AC

## 2024-03-27 MED ORDER — TETANUS-DIPHTH-ACELL PERTUSSIS 5-2.5-18.5 LF-MCG/0.5 IM SUSY
0.5000 mL | PREFILLED_SYRINGE | Freq: Once | INTRAMUSCULAR | Status: AC
  Administered 2024-03-27: 0.5 mL via INTRAMUSCULAR

## 2024-03-27 MED ORDER — LIDOCAINE HCL (PF) 2 % IJ SOLN
INTRAMUSCULAR | Status: AC
  Filled 2024-03-27: qty 5

## 2024-03-27 MED ORDER — TETANUS-DIPHTH-ACELL PERTUSSIS 5-2.5-18.5 LF-MCG/0.5 IM SUSY
PREFILLED_SYRINGE | INTRAMUSCULAR | Status: AC
  Filled 2024-03-27: qty 0.5

## 2024-03-27 MED ORDER — CEPHALEXIN 500 MG PO CAPS: 500.0000 mg | ORAL_CAPSULE | Freq: Three times a day (TID) | ORAL | 0 refills | Status: AC

## 2024-03-27 NOTE — ED Triage Notes (Signed)
 Pt states he has left shoulder pain X 6 months, states he lift a lot at work. He has been taking tylenol  as needed.   Pt has a left pointer finger laceration that happened yesterday before noon, he cut it on a grinder. He has washed the wound 4-5 times yesterday. Last TDAP about 30 years.   He would like a refill of all three of his BP meds.

## 2024-03-27 NOTE — Discharge Instructions (Addendum)
 Thank you for letting me fix your cut today!   You broke the tip of your finger as a result of cutting your finger, therefore I would like for you to take Keflex  antibiotic every 8 hours for the next 7 days with a snack.  Please call the hand specialist listed on your paperwork (Dr. Lorretta) to follow-up with them in 1 week.   Wound care:  - Keep wound completely dry for 24 hours. After 24 hours, you may gently wash the wound by allowing water to run over the wound. You may also use a small amount of antibacterial soap.  Do not scrub the wound as this can cause damage to the sutures/staples.  - Cover the area with a nonstick bandage and change the bandage 2 times a day.   - No need for ointments or lotions to the wound.  - Cover during the day if in dirty/busy environment, leave open to air at nighttime.   You should have the sutures removed in 10 days by your primary care provider or at urgent care. Return sooner than 10 days if you experience discharge from your laceration, redness around your laceration, warmth around your laceration, or fever.   You may take over the counter medicines as needed for aches and pains once the numbing wears off.   BP Medication Refill:  Your blood pressure was elevated in the clinic today. Continue taking blood pressure medication(s) as prescribed.  Continue exercising and eating low salt diet to reduce BP naturally.  If you develop chest pain, shortness of breath, weakness on one side of your body, severe headache, dizziness, etc, please go to the ER.  Follow-up with PCP for ongoing evaluation and management of your high blood pressure.

## 2024-03-27 NOTE — ED Provider Notes (Signed)
 [facility]    CSN: [id number] Arrival date & time: 03/27/24  0806      History   Chief Complaint Chief Complaint  Patient presents with   Shoulder Pain   Laceration   Medication Refill    HPI Javier Obrien is a 63 y.o. male.   Javier Obrien is a 63 y.o. male presenting for chief complaint of Shoulder Pain, Laceration, and Medication Refill.  Patient cut his left index finger while he was cleaning a grinder yesterday.  Laceration is linear and shortly distal to the left index finger DIP joint on the palmar side of the finger.  Wound bled initially, bleeding controlled with pressure.  Injury happened yesterday morning at around 12 noon (20 hours ago).  Reports reduced range of motion at the tip of the right index finger since laceration.  Denies damage to the nail. Denies paresthesias distally, use of blood thinner, and foreign body to the laceration. He was able to cleanse the wound generously with water and peroxide prior to arrival urgent care. His last tetanus injection was over 30 years ago.  He would also like a refill of his blood pressure medication. His blood pressure is currently 194/111.  He has a primary care provider and is requesting to have a physical today. Takes amlodipine , losartan hydrochlorothiazide , and olmesartan . He has taken all of his blood pressure medications this morning (30 minutes before arrival). Denies CP, SOB, palpitations, dizziness, extremity weakness, headache, vision changes, and paresthesias.      Past Medical History:  Diagnosis Date   Hypertension     There are no active problems to display for this patient.   Past Surgical History:  Procedure Laterality Date   HERNIA REPAIR     KNEE ARTHROSCOPY WITH MENISCAL REPAIR     Right neck/wrist/hand         Home Medications    Prior to Admission medications   Medication Sig Start Date End Date Taking? Authorizing Provider  acetaminophen  (TYLENOL ) 500 MG tablet  Take 1 tablet (500 mg total) by mouth every 6 (six) hours as needed. 12/07/19  Yes Avegno, Komlanvi S, FNP  cephALEXin  (KEFLEX ) 500 MG capsule Take 1 capsule (500 mg total) by mouth 3 (three) times daily for 7 days. 03/27/24 04/03/24 Yes StanhopeDorna HERO, FNP  albuterol  (VENTOLIN  HFA) 108 (90 Base) MCG/ACT inhaler Inhale 1-2 puffs into the lungs every 6 (six) hours as needed. 08/04/23   Mayer, Jodi R, NP  amLODipine  (NORVASC ) 5 MG tablet Take 1 tablet (5 mg total) by mouth at bedtime. 03/27/24   Enedelia Dorna HERO, FNP  diclofenac  (CATAFLAM ) 50 MG tablet Take 1 tablet (50 mg total) by mouth 3 (three) times daily. 12/20/15   Vincente Lynwood BIRCH, MD  hydrochlorothiazide  (HYDRODIURIL ) 12.5 MG tablet Take 1 tablet (12.5 mg total) by mouth daily. 03/27/24   Enedelia Dorna HERO, FNP  naproxen (NAPROSYN) 500 MG tablet naproxen 500 mg tablet  Take 1 tablet twice a day by oral route for 10 days.    [provider]  olmesartan  (BENICAR ) 40 MG tablet Take 1 tablet (40 mg total) by mouth daily. 03/27/24   Enedelia Dorna HERO, FNP  predniSONE  (STERAPRED UNI-PAK 21 TAB) 10 MG (21) TBPK tablet Take 6 tabs by mouth daily  for 2 days, then 5 tabs for 2 days, then 4 tabs for 2 days, then 3 tabs for 2 days, 2 tabs for 2 days, then 1 tab by mouth daily for 2 days 12/07/19  Avegno, Komlanvi S, FNP  promethazine -dextromethorphan (PROMETHAZINE -DM) 6.25-15 MG/5ML syrup Take 5 mLs by mouth at bedtime as needed for cough. 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   Review of Systems Review of Systems Per HPI  Physical  Exam Triage Vital Signs ED Triage Vitals  Encounter Vitals Group     BP 03/27/24 0844 (!) 194/111     Girls Systolic BP Percentile --      Girls Diastolic BP Percentile --      Boys Systolic BP Percentile --      Boys Diastolic BP Percentile --      Pulse Rate 03/27/24 0844 66     Resp 03/27/24 0844 16     Temp 03/27/24 0844 98 F (36.7 C)     Temp Source 03/27/24 0844 Oral     SpO2 03/27/24 0844 97 %     Weight --      Height --      Head Circumference --      Peak Flow --      Pain Score 03/27/24 0842 6     Pain Loc --      Pain Education --      Exclude from Growth Chart --    No data found.  Updated Vital Signs BP (!) 187/109   Pulse (!) 56   Temp 98 F (36.7 C) (Oral)   Resp 20   SpO2 95%   Visual Acuity Right Eye Distance:   Left Eye Distance:   Bilateral Distance:    Right Eye Near:   Left Eye Near:    Bilateral Near:     Physical Exam Vitals and nursing note reviewed.  Constitutional:      Appearance: He is not ill-appearing or toxic-appearing.  HENT:     Head: Normocephalic and atraumatic.     Right Ear: Hearing and external ear normal.     Left Ear: Hearing and external ear normal.     Nose: Nose normal.     Mouth/Throat:     Lips: Pink.  Eyes:     General: Lids are normal. Vision grossly intact. Gaze aligned appropriately.     Extraocular Movements: Extraocular movements intact.     Conjunctiva/sclera: Conjunctivae normal.  Cardiovascular:     Rate and Rhythm: Normal rate and regular rhythm.     Heart sounds: Normal heart sounds, S1 normal and S2 normal.  Pulmonary:     Effort: Pulmonary effort is normal. No respiratory distress.     Breath sounds: Normal breath sounds and air entry.  Musculoskeletal:     Right hand: Decreased range of motion: Decreased range of motion at the DIP joint of the left index finger..     Left hand: Laceration and tenderness present. No swelling, deformity or bony tenderness. Decreased range of motion. Normal  strength. Normal sensation. There is no disruption of two-point discrimination. Normal capillary refill. Normal pulse.     Cervical back: Neck supple.     Comments: Left  index finger: Decreased ROM secondary to pain at the DIP joint.  Laceration present, nonbleeding.  No acute damage to the nail.  Less than 2 cap refill.  Sensation intact distally.  +2 left radial pulse.  Skin:    General: Skin is warm and dry.     Capillary Refill: Capillary refill takes less than 2 seconds.     Findings: Laceration (Laceration to the distal pad of the left index finger as seen in image below.  Sensation intact distally) present. No rash.  Neurological:     General: No focal deficit present.     Mental Status: He is alert and oriented to person, place, and time. Mental status is at baseline.     Cranial Nerves: No dysarthria or facial asymmetry.  Psychiatric:        Mood and Affect: Mood normal.        Speech: Speech normal.        Behavior: Behavior normal.        Thought Content: Thought content normal.        Judgment: Judgment normal.         UC Treatments / Results  Labs (all labs ordered are listed, but only abnormal results are displayed) Labs Reviewed  COMPREHENSIVE METABOLIC PANEL WITH GFR    EKG   Radiology No results found.  Procedures Laceration Repair  Date/Time: 03/27/2024 10:43 AM  Performed by: Enedelia Dorna HERO, FNP Authorized by: Enedelia Dorna HERO, FNP   Consent:    Consent obtained:  Verbal   Consent given by:  Patient   Risks, benefits, and alternatives were discussed: yes     Risks discussed:  Infection, need for additional repair, nerve damage, retained foreign body, tendon damage, vascular damage, poor wound healing, poor cosmetic result and pain   Alternatives discussed:  No treatment Universal protocol:    Patient identity confirmed:  Verbally with patient Anesthesia:    Anesthesia method:  Local infiltration   Local anesthetic:  Lidocaine  2% w/o  epi Laceration details:    Location:  Finger   Finger location:  L index finger   Length (cm):  1.5   Depth (mm):  5 Exploration:    Imaging obtained: x-ray     Imaging outcome: foreign body not noted     Wound extent: underlying fracture   Treatment:    Area cleansed with:  Povidone-iodine, chlorhexidine and soap and water (Finger soaked for 15 minutes in soapy chlorhexidine water)   Amount of cleaning:  Standard Skin repair:    Repair method:  Sutures   Suture size:  5-0   Suture material:  Prolene   Suture technique:  Simple interrupted   Number of sutures:  6 Approximation:    Approximation:  Close Repair type:    Repair type:  Simple Post-procedure details:    Dressing:  Non-adherent dressing   Procedure completion:  Tolerated well, no immediate complications  (including critical care time)  Medications Ordered in UC Medications  Tdap (BOOSTRIX ) injection 0.5 mL (0.5 mLs Intramuscular Given 03/27/24 0913)    Initial Impression / Assessment and Plan / UC Course  I have reviewed the triage vital signs and the nursing notes.  Pertinent labs & imaging results that were available during my care of the patient were reviewed by me and considered in my medical decision making (see chart for details).   1.  Laceration of left index finger without foreign body without damage to nail, open fracture of tuft of left  distal phalanx of finger, need for tetanus booster Laceration repaired, see procedure note above for details. Discussed wound care and cleaning at home.  Imaging: X-ray of the left index finger shows small avulsion fracture on lateral view to the distal phalanx.  Radiology overread pending at time of discharge.  Given he has an open fracture to the distal part of the left index finger, cephalexin  antibiotic has been ordered 500 mg 3 times daily for 7 days for infection prophylaxis.  Infection return precautions discussed.  Suture removal in 10 days.  Tdap updated  today.  Tylenol  as needed for pain at home.  Advised to rest and avoid activities that may increase tension to wound/sutures or expose wound to infection. Excuse note given.   Walking referral to hand specialist provided (Dr. Lorretta).   2. Elevated BP reading in office with diagnosis of HTN BP elevated in clinic today initially and on recheck prior to discharge. No red flag signs/symptoms indicating need for referral to ED due to elevated BP.  Discussed lifestyle and dietary changes to lower BP further. Advised to continue taking BP medication as prescribed and follow-up with PCP to discuss management of HTN further.  BP Readings from Last 3 Encounters:  03/27/24 (!) 187/109  08/04/23 129/86  12/07/19 (!) 175/105    Blood pressure medications refilled today.  Advised to follow-up with PCP for ongoing refills.  CMP drawn today for medication monitoring to evaluate renal/hepatic function.  I am unable to see any chemistry panel on file in epic/Care Everywhere.   Counseled patient on potential for adverse effects with medications prescribed/recommended today, strict ER and return-to-clinic precautions discussed, patient verbalized understanding.    Final Clinical Impressions(s) / UC Diagnoses   Final diagnoses:  Laceration of left index finger without foreign body without damage to nail, initial encounter  Open fracture of tuft of distal phalanx of finger  Elevated blood pressure reading in office with diagnosis of hypertension  Need for tetanus booster     Discharge Instructions      Thank you for letting me fix your cut today!   You broke the tip of your finger as a result of cutting your finger, therefore I would like for you to take Keflex  antibiotic every 8 hours for the next 7 days with a snack.  Please call the hand specialist listed on your paperwork (Dr. Lorretta) to follow-up with them in 1 week.   Wound care:  - Keep wound completely dry for 24 hours. After 24 hours,  you may gently wash the wound by allowing water to run over the wound. You may also use a small amount of antibacterial soap.  Do not scrub the wound as this can cause damage to the sutures/staples.  - Cover the area with a nonstick bandage and change the bandage 2 times a day.   - No need for ointments or lotions to the wound.  - Cover during the day if in dirty/busy environment, leave open to air at nighttime.   You should have the sutures removed in 10 days by your primary care provider or at urgent care. Return sooner than 10 days if you experience discharge from your laceration, redness around your laceration, warmth around your laceration, or fever.   You may take over the counter medicines as needed for aches and pains once the numbing wears off.   BP Medication Refill:  Your blood pressure was elevated in the clinic today. Continue taking blood pressure medication(s) as prescribed.  Continue exercising and eating low salt diet to reduce BP naturally.  If you develop chest pain, shortness of breath, weakness on one side of your body, severe headache, dizziness, etc, please go to the ER.  Follow-up with PCP for ongoing evaluation and management of your high blood pressure.       ED Prescriptions     Medication Sig Dispense Auth. Provider   olmesartan  (BENICAR ) 40 MG tablet Take 1 tablet (40 mg total) by mouth daily. 30 tablet Omelia Marquart M, FNP   hydrochlorothiazide  (HYDRODIURIL ) 12.5 MG tablet Take 1 tablet (12.5 mg total) by mouth daily. 30 tablet Russell Quinney M, FNP   amLODipine  (NORVASC ) 5 MG tablet Take 1 tablet (5 mg total) by mouth at bedtime. 30 tablet Enedelia Going M, FNP   cephALEXin  (KEFLEX ) 500 MG capsule Take 1 capsule (500 mg total) by mouth 3 (three) times daily for 7 days. 21 capsule Enedelia Going HERO, FNP      PDMP not reviewed this encounter.   Enedelia Going HERO, [place] 03/27/24 1046

## 2024-04-09 ENCOUNTER — Telehealth: Payer: Self-pay | Admitting: Family Medicine

## 2024-04-09 NOTE — Telephone Encounter (Unsigned)
 Copied from CRM #[id number]. Topic: Appointments - Scheduling Inquiry for Clinic >> Apr 09, 2024 10:24 AM Donee H wrote: Reason for CRM: Patient's wife Adell Bogan call to schedule patient for a new patient visit to establish care. He does not have a preference of provider but wife is an establish patient with [facility]. Appointment was eventually scheduled with Bruna Creighton for Dec. 5, 2025. Wife states patient needs to be seen sooner if possible in Oct due to left shoulder. Patient was added to waitlist but wanted to see if it was any other options.

## 2024-04-27 ENCOUNTER — Other Ambulatory Visit: Payer: Self-pay | Admitting: Orthopedic Surgery

## 2024-05-02 ENCOUNTER — Encounter (HOSPITAL_BASED_OUTPATIENT_CLINIC_OR_DEPARTMENT_OTHER): Payer: Self-pay | Admitting: Orthopedic Surgery

## 2024-05-02 ENCOUNTER — Other Ambulatory Visit: Payer: Self-pay

## 2024-05-04 NOTE — Therapy (Deleted)
 OUTPATIENT PHYSICAL THERAPY SHOULDER EVALUATION   Patient Name: Javier Obrien MRN: [id number] DOB:[birth year], 63 y.o., male Today's Date: 05/04/2024  END OF SESSION:   Past Medical History:  Diagnosis Date   Arthritis    left shoulder   Hypertension    Past Surgical History:  Procedure Laterality Date   HERNIA REPAIR     KNEE ARTHROSCOPY WITH MENISCAL REPAIR     Right neck/wrist/hand     There are no active problems to display for this patient.   PCP: Patient, No Pcp Per   REFERRING PROVIDER: Kimberly Sharper, MD  REFERRING DIAG: M25.512 (ICD-10-CM) - Pain in left shoulder  THERAPY DIAG:  No diagnosis found.  Rationale for Evaluation and Treatment: Rehabilitation  ONSET DATE: ***  SUBJECTIVE:                                                                                                                                                                                      SUBJECTIVE STATEMENT: *** Hand dominance: {MISC; OT HAND DOMINANCE:[phone]}  PERTINENT HISTORY: None available  PAIN:  Are you having pain? Yes: NPRS scale: *** Pain location: *** Pain description: *** Aggravating factors: *** Relieving factors: ***  PRECAUTIONS: None  RED FLAGS: None   WEIGHT BEARING RESTRICTIONS: No  FALLS:  Has patient fallen in last 6 months? No  OCCUPATION: ***  PLOF: Independent  PATIENT GOALS:To manage my shoulder symptoms  NEXT MD VISIT:   OBJECTIVE:  Note: Objective measures were completed at Evaluation unless otherwise noted.  DIAGNOSTIC FINDINGS:  none  PATIENT SURVEYS:  Quick Dash:  QUICK DASH  Please rate your ability do the following activities in the last week by selecting the number below the appropriate response.   Activities Rating  Open a tight or new jar.  {Q-dash (1-6):32984}  Do heavy household chores (e.g., wash walls, floors). {Q-dash Development worker, community  Carry a shopping bag or briefcase {Q-dash [facility] your back.  {Q-dash (1-6):32984}  Use a knife to cut food. {Q-dash (1-6):32984}  Recreational activities in which you take some force or impact through your arm, shoulder or hand (e.g., golf, hammering, tennis, etc.). {Q-dash (1-6):32984}  During the past week, to what extent has your arm, shoulder or hand problem interfered with your normal social activities with family, friends, neighbors or groups?  {Q-Dash 7:32985}  During the past week, were you limited in your work or other regular daily activities as a result of your arm, shoulder or hand problem? {Q-dash 8:32988}  Rate the severity of the following symptoms in the last week: Arm, Shoulder, or hand pain. {Q-dash 0-89:67013}  Rate the severity of the following symptoms in the last  week: Tingling (pins and needles) in your arm, shoulder or hand. {Q-dash 0-89:67013}  During the past week, how much difficulty have you had sleeping because of the pain in your arm, shoulder or hand?  {Q-dash 11:32987}   (A QuickDASH score may not be calculated if there is greater than 1 missing item.)  Quick Dash Disability/Symptom Score: [(sum of *** (n) responses/*** (n)] x 25 = ***  Minimally Clinically Important Difference (MCID): 15-20 points  (Franchignoni, F. et al. (2013). Minimally clinically important difference of the disabilities of the arm, shoulder, and hand outcome measures (DASH) and its shortened version (Quick DASH). Journal of Orthopaedic & Sports Physical Therapy, 44(1), 30-39)   POSTURE: ***  UPPER EXTREMITY ROM:   {AROM/PROM:27142} ROM Right eval Left eval  Shoulder flexion    Shoulder extension    Shoulder abduction    Shoulder adduction    Shoulder internal rotation    Shoulder external rotation    Elbow flexion    Elbow extension    Wrist flexion    Wrist extension    Wrist ulnar deviation    Wrist radial deviation    Wrist pronation    Wrist supination    (Blank rows = not tested)  UPPER EXTREMITY MMT:  MMT Right eval  Left eval  Shoulder flexion    Shoulder extension    Shoulder abduction    Shoulder adduction    Shoulder internal rotation    Shoulder external rotation    Middle trapezius    Lower trapezius    Elbow flexion    Elbow extension    Wrist flexion    Wrist extension    Wrist ulnar deviation    Wrist radial deviation    Wrist pronation    Wrist supination    Grip strength (lbs)    (Blank rows = not tested)  SHOULDER SPECIAL TESTS: Impingement tests: {shoulder impingement test:25231:a} SLAP lesions: {SLAP lesions:25232} Instability tests: {shoulder instability test:25233} Rotator cuff assessment: {rotator cuff assessment:25234} Biceps assessment: {biceps assessment:25235}  JOINT MOBILITY TESTING:  ***  PALPATION:  ***                                                                                                                             TREATMENT DATE: ***   PATIENT EDUCATION: Education details: Discussed eval findings, rehab rationale and POC and patient is in agreement  Person educated: Patient Education method: Explanation and Handouts Education comprehension: verbalized understanding and needs further education  HOME EXERCISE PROGRAM: ***  ASSESSMENT:  CLINICAL IMPRESSION: Patient is a 63 y.o. male who was seen today for physical therapy evaluation and treatment for L shoulder pain.   OBJECTIVE IMPAIRMENTS: {opptimpairments:25111}.   ACTIVITY LIMITATIONS: {activitylimitations:27494}  PERSONAL FACTORS: {Personal factors:25162} are also affecting patient's functional outcome.   REHAB POTENTIAL: Good  CLINICAL DECISION MAKING: Stable/uncomplicated  EVALUATION COMPLEXITY: Moderate   GOALS: Goals reviewed with patient? No  SHORT TERM GOALS: Target date: ***  Patient  to demonstrate independence in HEP  Baseline: Goal status: INITIAL  2.  *** Baseline:  Goal status: INITIAL  3.  *** Baseline:  Goal status: INITIAL  4.  *** Baseline:   Goal status: INITIAL  5.  *** Baseline:  Goal status: INITIAL  6.  *** Baseline:  Goal status: INITIAL  LONG TERM GOALS: Target date: ***  Patient will acknowledge ***/10 pain at least once during episode of care   Baseline:  Goal status: INITIAL  2.  Patient will score at least ***% on FOTO to signify clinically meaningful improvement in functional abilities.   Baseline:  Goal status: INITIAL  3.  *** Baseline:  Goal status: INITIAL  4.  *** Baseline:  Goal status: INITIAL  5.  *** Baseline:  Goal status: INITIAL  6.  *** Baseline:  Goal status: INITIAL  PLAN:  PT FREQUENCY: 1-2x/week  PT DURATION: 6 weeks  PLANNED INTERVENTIONS: 97110-Therapeutic exercises, 97530- Therapeutic activity, 97112- Neuromuscular re-education, 97535- Self Care, 02859- Manual therapy, and Patient/Family education  PLAN FOR NEXT SESSION: HEP review and update, manual techniques as appropriate, aerobic tasks, ROM and flexibility activities, strengthening and PREs, TPDN, gait and balance training as needed     Reyes CHRISTELLA Kohut, PT 05/04/2024, 11:43 AM

## 2024-05-08 ENCOUNTER — Ambulatory Visit (HOSPITAL_BASED_OUTPATIENT_CLINIC_OR_DEPARTMENT_OTHER): Admitting: Anesthesiology

## 2024-05-08 ENCOUNTER — Encounter (HOSPITAL_BASED_OUTPATIENT_CLINIC_OR_DEPARTMENT_OTHER): Payer: Self-pay | Admitting: Orthopedic Surgery

## 2024-05-08 ENCOUNTER — Other Ambulatory Visit: Payer: Self-pay

## 2024-05-08 ENCOUNTER — Encounter (HOSPITAL_BASED_OUTPATIENT_CLINIC_OR_DEPARTMENT_OTHER): Admission: RE | Disposition: A | Payer: Self-pay | Source: Home / Self Care | Attending: Orthopedic Surgery

## 2024-05-08 ENCOUNTER — Ambulatory Visit

## 2024-05-08 ENCOUNTER — Ambulatory Visit (HOSPITAL_BASED_OUTPATIENT_CLINIC_OR_DEPARTMENT_OTHER)
Admission: RE | Admit: 2024-05-08 | Discharge: 2024-05-08 | Disposition: A | Discharge status: Home / Self Care | Attending: Orthopedic Surgery | Admitting: Orthopedic Surgery

## 2024-05-08 DIAGNOSIS — Y9389 Activity, other specified: Secondary | ICD-10-CM | POA: Diagnosis not present

## 2024-05-08 DIAGNOSIS — I4510 Unspecified right bundle-branch block: Secondary | ICD-10-CM | POA: Insufficient documentation

## 2024-05-08 DIAGNOSIS — I10 Essential (primary) hypertension: Secondary | ICD-10-CM | POA: Diagnosis not present

## 2024-05-08 DIAGNOSIS — S66121A Laceration of flexor muscle, fascia and tendon of left index finger at wrist and hand level, initial encounter: Secondary | ICD-10-CM | POA: Diagnosis not present

## 2024-05-08 DIAGNOSIS — Z79899 Other long term (current) drug therapy: Secondary | ICD-10-CM | POA: Insufficient documentation

## 2024-05-08 DIAGNOSIS — Z87891 Personal history of nicotine dependence: Secondary | ICD-10-CM | POA: Insufficient documentation

## 2024-05-08 DIAGNOSIS — W3189XA Contact with other specified machinery, initial encounter: Secondary | ICD-10-CM | POA: Diagnosis not present

## 2024-05-08 DIAGNOSIS — S61219A Laceration without foreign body of unspecified finger without damage to nail, initial encounter: Secondary | ICD-10-CM | POA: Diagnosis present

## 2024-05-08 HISTORY — DX: Unspecified osteoarthritis, unspecified site: M19.90

## 2024-05-08 HISTORY — PX: FLEXOR TENDON REPAIR: SHX6501

## 2024-05-08 LAB — BASIC METABOLIC PANEL WITH GFR
Anion gap: 8 (ref 5–15)
BUN: 11 mg/dL (ref 8–23)
CO2: 25 mmol/L (ref 22–32)
Calcium: 9.2 mg/dL (ref 8.9–10.3)
Chloride: 104 mmol/L (ref 98–111)
Creatinine, Ser: 0.88 mg/dL (ref 0.61–1.24)
GFR, Estimated: >60 mL/min (ref >60)
Glucose, Bld: 97 mg/dL (ref 70–99)
Potassium: 4.2 mmol/L (ref 3.5–5.1)
Sodium: 137 mmol/L (ref 135–145)

## 2024-05-08 SURGERY — REPAIR, TENDON, FLEXOR
Anesthesia: General | Site: Index Finger | Laterality: Left

## 2024-05-08 MED ORDER — FENTANYL CITRATE (PF) 100 MCG/2ML IJ SOLN
INTRAMUSCULAR | Status: AC
  Filled 2024-05-08: qty 2

## 2024-05-08 MED ORDER — MIDAZOLAM HCL 2 MG/2ML IJ SOLN
INTRAMUSCULAR | Status: AC
  Filled 2024-05-08: qty 2

## 2024-05-08 MED ORDER — PROPOFOL 10 MG/ML IV BOLUS
INTRAVENOUS | Status: AC
  Filled 2024-05-08: qty 20

## 2024-05-08 MED ORDER — LACTATED RINGERS IV SOLN: INTRAVENOUS | Status: DC

## 2024-05-08 MED ORDER — DEXAMETHASONE SOD PHOSPHATE PF 10 MG/ML IJ SOLN
INTRAMUSCULAR | Status: DC | PRN
  Administered 2024-05-08: 5 mg via INTRAVENOUS

## 2024-05-08 MED ORDER — ARTIFICIAL TEARS OPHTHALMIC OINT
TOPICAL_OINTMENT | OPHTHALMIC | Status: DC | PRN
  Administered 2024-05-08: 1 via OPHTHALMIC

## 2024-05-08 MED ORDER — ACETAMINOPHEN 500 MG PO TABS
1000.0000 mg | ORAL_TABLET | Freq: Once | ORAL | Status: AC
  Administered 2024-05-08: 1000 mg via ORAL

## 2024-05-08 MED ORDER — GLYCOPYRROLATE 0.2 MG/ML IJ SOLN
INTRAMUSCULAR | Status: DC | PRN
  Administered 2024-05-08 (×2): .1 mg via INTRAVENOUS

## 2024-05-08 MED ORDER — KETOROLAC TROMETHAMINE 30 MG/ML IJ SOLN: 30.0000 mg | Freq: Once | INTRAMUSCULAR | Status: DC | PRN

## 2024-05-08 MED ORDER — LIDOCAINE 2% (20 MG/ML) 5 ML SYRINGE
INTRAMUSCULAR | Status: AC
  Filled 2024-05-08: qty 5

## 2024-05-08 MED ORDER — ONDANSETRON HCL 4 MG/2ML IJ SOLN
INTRAMUSCULAR | Status: DC | PRN
  Administered 2024-05-08: 4 mg via INTRAVENOUS

## 2024-05-08 MED ORDER — ARTIFICIAL TEARS OPHTHALMIC OINT
TOPICAL_OINTMENT | OPHTHALMIC | Status: AC
  Filled 2024-05-08: qty 3.5

## 2024-05-08 MED ORDER — ACETAMINOPHEN 500 MG PO TABS
ORAL_TABLET | ORAL | Status: AC
  Filled 2024-05-08: qty 2

## 2024-05-08 MED ORDER — AMISULPRIDE (ANTIEMETIC) 5 MG/2ML IV SOLN: 10.0000 mg | Freq: Once | INTRAVENOUS | Status: DC | PRN

## 2024-05-08 MED ORDER — LIDOCAINE HCL (CARDIAC) PF 100 MG/5ML IV SOSY
PREFILLED_SYRINGE | INTRAVENOUS | Status: DC | PRN
  Administered 2024-05-08: 60 mg via INTRATRACHEAL

## 2024-05-08 MED ORDER — CEFAZOLIN SODIUM-DEXTROSE 2-4 GM/100ML-% IV SOLN
2.0000 g | INTRAVENOUS | Status: AC
  Administered 2024-05-08: 2 g via INTRAVENOUS

## 2024-05-08 MED ORDER — 0.9 % SODIUM CHLORIDE (POUR BTL) OPTIME
TOPICAL | Status: DC | PRN
  Administered 2024-05-08: 100 mL

## 2024-05-08 MED ORDER — EPHEDRINE 5 MG/ML INJ
INTRAVENOUS | Status: AC
  Filled 2024-05-08: qty 5

## 2024-05-08 MED ORDER — OXYCODONE HCL 5 MG/5ML PO SOLN: 5.0000 mg | Freq: Once | ORAL | Status: DC | PRN

## 2024-05-08 MED ORDER — HYDROCODONE-ACETAMINOPHEN 5-325 MG PO TABS: 1.0000 | ORAL_TABLET | Freq: Four times a day (QID) | ORAL | 0 refills | Status: AC | PRN

## 2024-05-08 MED ORDER — CEFAZOLIN SODIUM-DEXTROSE 2-4 GM/100ML-% IV SOLN
INTRAVENOUS | Status: AC
  Filled 2024-05-08: qty 100

## 2024-05-08 MED ORDER — BUPIVACAINE HCL (PF) 0.25 % IJ SOLN
INTRAMUSCULAR | Status: DC | PRN
  Administered 2024-05-08: 9 mL

## 2024-05-08 MED ORDER — MIDAZOLAM HCL (PF) 2 MG/2ML IJ SOLN
INTRAMUSCULAR | Status: DC | PRN
  Administered 2024-05-08 (×2): 1 mg via INTRAVENOUS

## 2024-05-08 MED ORDER — OXYCODONE HCL 5 MG PO TABS: 5.0000 mg | ORAL_TABLET | Freq: Once | ORAL | Status: DC | PRN

## 2024-05-08 MED ORDER — FENTANYL CITRATE (PF) 100 MCG/2ML IJ SOLN: 25.0000 ug | INTRAMUSCULAR | Status: DC | PRN

## 2024-05-08 MED ORDER — ONDANSETRON HCL 4 MG/2ML IJ SOLN
INTRAMUSCULAR | Status: AC
  Filled 2024-05-08: qty 2

## 2024-05-08 MED ORDER — EPHEDRINE SULFATE (PRESSORS) 25 MG/5ML IV SOSY
PREFILLED_SYRINGE | INTRAVENOUS | Status: DC | PRN
  Administered 2024-05-08: 5 mg via INTRAVENOUS
  Administered 2024-05-08 (×2): 10 mg via INTRAVENOUS

## 2024-05-08 MED ORDER — PROPOFOL 10 MG/ML IV BOLUS
INTRAVENOUS | Status: DC | PRN
  Administered 2024-05-08: 200 mg via INTRAVENOUS

## 2024-05-08 MED ORDER — FENTANYL CITRATE (PF) 100 MCG/2ML IJ SOLN
INTRAMUSCULAR | Status: DC | PRN
  Administered 2024-05-08 (×2): 12.5 ug via INTRAVENOUS
  Administered 2024-05-08 (×2): 25 ug via INTRAVENOUS
  Administered 2024-05-08: 50 ug via INTRAVENOUS

## 2024-05-08 SURGICAL SUPPLY — 67 items
BAG DECANTER FOR FLEXI CONT (MISCELLANEOUS) IMPLANT
BLADE MINI RND TIP GREEN BEAV (BLADE) IMPLANT
BLADE SURG 15 STRL LF DISP TIS (BLADE) ×2 IMPLANT
BNDG COMPR ESMARK 4X3 LF (GAUZE/BANDAGES/DRESSINGS) ×1 IMPLANT
BNDG ELASTIC 2INX 5YD STR LF (GAUZE/BANDAGES/DRESSINGS) IMPLANT
BNDG ELASTIC 3INX 5YD STR LF (GAUZE/BANDAGES/DRESSINGS) ×1 IMPLANT
BNDG GAUZE DERMACEA FLUFF 4 (GAUZE/BANDAGES/DRESSINGS) IMPLANT
CATH ROBINSON RED A/P 10FR (CATHETERS) IMPLANT
CHLORAPREP W/TINT 26 (MISCELLANEOUS) ×1 IMPLANT
CORD BIPOLAR FORCEPS 12FT (ELECTRODE) ×1 IMPLANT
COTTONBALL LRG STERILE PKG (GAUZE/BANDAGES/DRESSINGS) IMPLANT
COVER BACK TABLE 60X90IN (DRAPES) ×1 IMPLANT
COVER MAYO STAND STRL (DRAPES) ×1 IMPLANT
CUFF TOURN SGL QUICK 18X4 (TOURNIQUET CUFF) ×1 IMPLANT
DRAPE EXTREMITY T 121X128X90 (DISPOSABLE) ×1 IMPLANT
DRAPE OEC MINIVIEW 54X84 (DRAPES) IMPLANT
DRAPE SURG 17X23 STRL (DRAPES) ×1 IMPLANT
GAUZE 4X4 16PLY ~~LOC~~+RFID DBL (SPONGE) IMPLANT
GAUZE PAD ABD 8X10 STRL (GAUZE/BANDAGES/DRESSINGS) IMPLANT
GAUZE SPONGE 4X4 12PLY STRL (GAUZE/BANDAGES/DRESSINGS) ×1 IMPLANT
GAUZE STRETCH 2X75IN STRL (MISCELLANEOUS) IMPLANT
GAUZE XEROFORM 1X8 LF (GAUZE/BANDAGES/DRESSINGS) ×1 IMPLANT
GLOVE BIO SURGEON STRL SZ7.5 (GLOVE) ×1 IMPLANT
GLOVE BIOGEL PI IND STRL 8 (GLOVE) ×1 IMPLANT
GLOVE BIOGEL PI IND STRL 8.5 (GLOVE) IMPLANT
GLOVE SURG ORTHO 8.0 STRL STRW (GLOVE) IMPLANT
GOWN STRL REUS W/ TWL LRG LVL3 (GOWN DISPOSABLE) ×1 IMPLANT
GOWN STRL REUS W/TWL XL LVL3 (GOWN DISPOSABLE) ×1 IMPLANT
KWIRE DBL .035X4 NSTRL (WIRE) IMPLANT
LOOP VASCLR MAXI BLUE 18IN ST (MISCELLANEOUS) IMPLANT
NDL HYPO 25X1 1.5 SAFETY (NEEDLE) IMPLANT
NDL KEITH (NEEDLE) IMPLANT
NEEDLE HYPO 25X1 1.5 SAFETY (NEEDLE) IMPLANT
NEEDLE KEITH (NEEDLE) IMPLANT
NS IRRIG 1000ML POUR BTL (IV SOLUTION) ×1 IMPLANT
PACK BASIN DAY SURGERY FS (CUSTOM PROCEDURE TRAY) ×1 IMPLANT
PAD CAST 3X4 CTTN HI CHSV (CAST SUPPLIES) ×1 IMPLANT
PAD CAST 4YDX4 CTTN HI CHSV (CAST SUPPLIES) IMPLANT
PADDING CAST ABS COTTON 3X4 (CAST SUPPLIES) IMPLANT
PADDING CAST ABS COTTON 4X4 ST (CAST SUPPLIES) ×1 IMPLANT
SLEEVE SCD COMPRESS KNEE MED (STOCKING) IMPLANT
SPIKE FLUID TRANSFER (MISCELLANEOUS) IMPLANT
SPLINT PLASTER CAST XFAST 3X15 (CAST SUPPLIES) IMPLANT
SPLINT PLASTER CAST XFAST 4X15 (CAST SUPPLIES) IMPLANT
STOCKINETTE 4X48 STRL (DRAPES) ×1 IMPLANT
SUT CHROMIC 5 0 P 3 (SUTURE) IMPLANT
SUT ETHIBOND 3-0 V-5 (SUTURE) IMPLANT
SUT ETHILON 3 0 PS 1 (SUTURE) IMPLANT
SUT ETHILON 4 0 PS 2 18 (SUTURE) IMPLANT
SUT MERSILENE 2.0 SH NDLE (SUTURE) IMPLANT
SUT MERSILENE 4 0 P 3 (SUTURE) IMPLANT
SUT POLY BUTTON 15MM (SUTURE) IMPLANT
SUT PROLENE 2 0 SH DA (SUTURE) IMPLANT
SUT PROLENE 4 0 P 3 18 (SUTURE) IMPLANT
SUT PROLENE 5 0 P 3 (SUTURE) IMPLANT
SUT SILK 2 0 SH (SUTURE) IMPLANT
SUT SILK 4 0 PS 2 (SUTURE) IMPLANT
SUT SUPRAMID 4-0 (SUTURE) IMPLANT
SUT VIC AB 4-0 P-3 18XBRD (SUTURE) IMPLANT
SUT VIC AB 4-0 PS2 18 (SUTURE) IMPLANT
SUTURE FIBERWR 4-0 18 DIA BLUE (SUTURE) IMPLANT
SYR BULB EAR ULCER 3OZ GRN STR (SYRINGE) ×1 IMPLANT
SYR CONTROL 10ML LL (SYRINGE) IMPLANT
SYR TOOMEY 50ML (SYRINGE) IMPLANT
TOWEL GREEN STERILE FF (TOWEL DISPOSABLE) ×2 IMPLANT
TUBE NG 5FR 35IN ENFIT (TUBING) IMPLANT
UNDERPAD 30X36 HEAVY ABSORB (UNDERPADS AND DIAPERS) ×1 IMPLANT

## 2024-05-08 NOTE — Op Note (Signed)
 NAME: Javier Obrien MEDICAL RECORD NO: [id number] DATE OF BIRTH: [birth year] FACILITY: [facility] LOCATION: [facility] PHYSICIAN: Lissete Maestas R. Elexus Barman, MD   OPERATIVE REPORT   DATE OF PROCEDURE: 05/08/24    PREOPERATIVE DIAGNOSIS: Left index finger flexor tendon laceration   POSTOPERATIVE DIAGNOSIS: Left index finger flexor tendon laceration   PROCEDURE: 1.  Left index finger repair of FDP tendon over button 2.  Left index finger ulnar digital nerve neurolysis   SURGEON:  Franky Curia, M.D.   ASSISTANT: Lamar Dragon, [facility]   ANESTHESIA:  General   INTRAVENOUS FLUIDS:  Per anesthesia flow sheet.   ESTIMATED BLOOD LOSS:  Minimal.   COMPLICATIONS:  None.   SPECIMENS:  none   TOURNIQUET TIME:    Total Tourniquet Time Documented: Upper Arm (Left) - 57 minutes Total: Upper Arm (Left) - 57 minutes    DISPOSITION:  Stable to PACU.   INDICATIONS: 63 year old male states that approximately 6 weeks ago he sustained a laceration to the left index finger.  Was seen in the emergency department where the wound was cleaned and sutured.  Follow-up in the office.  He is unable to flex the DIP joint.  He wishes to proceed with operative exploration with repair of flexor tendon as necessary.  Risks, benefits and alternatives of surgery were discussed including the risks of blood loss, infection, damage to nerves, vessels, tendons, ligaments, bone for surgery, need for additional surgery, complications with wound healing, continued pain, stiffness.  He voiced understanding of these risks and elected to proceed.  OPERATIVE COURSE:  After being identified preoperatively by myself,  the patient and I agreed on the procedure and site of the procedure.  The surgical site was marked.  Surgical consent had been signed. Preoperative IV antibiotic prophylaxis was given. He was transferred to the operating room and placed on the operating table in supine position with the left upper extremity on  an arm board.  General anesthesia was induced by the anesthesiologist.  Left upper extremity was prepped and draped in normal sterile orthopedic fashion.  A surgical pause was performed between the surgeons, anesthesia, and operating room staff and all were in agreement as to the patient, procedure, and site of procedure.  Tourniquet at the proximal aspect of the extremity was inflated to 250 mmHg after exsanguination of the arm with an Esmarch bandage.  A Bruner type incision was made starting in the distal phalanx into the middle phalanx.  This was carried in subcutaneous tissues by spreading technique.  There was scar distally.  There was laceration of the flexor tendon with a stump remaining at the distal phalanx.  The proximal stump of the tendon was identified distal to the DIP joint.  The flexor sheath was identified.  The A4 pulley ruptured.  Remaining portion of the flexor tendon sheath was preserved.  The ulnar digital nerve was explored.  There was scar surrounding it which was released.  Note was traced to the trifurcation and branches noted to be intact.  A 2-0 Prolene suture was passed in a pullout fashion through the FDP tendon.  This was then passed through the remaining flexor sheath and the tendon advanced to the base of the distal phalanx.  Arms of the 2-0 Prolene suture were passed around the bone and through the nail on the dorsum of the finger.  This was then tied over a button pulling the flexor tendon down into the footprint.  The wound was copiously irrigated with sterile saline.  The  A4 pulley was reapproximated with 4-0 Prolene suture in a figure-of-eight fashion.  The skin was closed with 4-0 nylon in a horizontal mattress fashion.  Digital block was performed with quarter percent plain Marcaine to aid in postoperative analgesia.  Wound was dressed with sterile Xeroform 4 x 4's and wrapped with a Kerlix bandage.  Dorsal blocking splint was placed with the wrist approximately 30 to 40  degrees of flexion with the MPs flexed and the MPs extended.  This was wrapped with Kerlix and Ace bandage.  The tourniquet was deflated at 7 minutes.  Fingertips were pink with brisk capillary refill after deflation of tourniquet.  The operative  drapes were broken down.  The patient was awoken from anesthesia safely.  He was transferred back to the stretcher and taken to PACU in stable condition.  I will see him back in the office in 1 week for postoperative followup.  I will give him a prescription for Norco 5/325 1 tab PO q6 hours prn pain, dispense #20.   Maresha Anastos, MD Electronically signed, 05/08/24

## 2024-05-08 NOTE — H&P (Signed)
 Javier Obrien is an 63 y.o. male.   Chief Complaint: tendon laceration HPI: 63 yo male states he sustained laceration to left index finger on a grinder at work 03/26/24.  Seen at [facility] ED where wound cleaned and sutured.  Unable to flex dip joint.  He wishes to proceed with surgical repair vs reconstruction.  Allergies: No Known Allergies  Past Medical History:  Diagnosis Date   Arthritis    left shoulder   Hypertension     Past Surgical History:  Procedure Laterality Date   HERNIA REPAIR     KNEE ARTHROSCOPY WITH MENISCAL REPAIR     Right neck/wrist/hand      Family History: Family History  Problem Relation Age of Onset   COPD Father     Social History:   reports that he has quit smoking. He does not have any smokeless tobacco history on file. He reports current alcohol use. He reports that he does not use drugs.  Medications: Medications Prior to Admission  Medication Sig Dispense Refill   amLODipine  (NORVASC ) 5 MG tablet Take 1 tablet (5 mg total) by mouth at bedtime. 30 tablet 0   hydrochlorothiazide  (HYDRODIURIL ) 12.5 MG tablet Take 1 tablet (12.5 mg total) by mouth daily. 30 tablet 0   olmesartan  (BENICAR ) 40 MG tablet Take 1 tablet (40 mg total) by mouth daily. 30 tablet 0   acetaminophen  (TYLENOL ) 500 MG tablet Take 1 tablet (500 mg total) by mouth every 6 (six) hours as needed. 30 tablet 0    Results for orders placed or performed during the hospital encounter of 05/08/24 (from the past 48 hours)  Basic metabolic panel per protocol     Status: None   Collection Time: 05/08/24 11:09 AM  Result Value Ref Range   Sodium 137 135 - 145 mmol/L   Potassium 4.2 3.5 - 5.1 mmol/L   Chloride 104 98 - 111 mmol/L   CO2 25 22 - 32 mmol/L   Glucose, Bld 97 70 - 99 mg/dL    Comment: Glucose reference range applies only to samples taken after fasting for at least 8 hours.   BUN 11 8 - 23 mg/dL   Creatinine, Ser 9.11 0.61 - 1.24 mg/dL   Calcium 9.2 8.9 - 89.6 mg/dL   GFR,  Estimated >39 >39 mL/min    Comment: (NOTE) Calculated using the CKD-EPI Creatinine Equation (2021)    Anion gap 8 5 - 15    Comment: Performed at [facility], [address] [place], [place] [address]    No results found.    Blood pressure (!) 135/98, pulse (!) 54, temperature 98.1 F (36.7 C), temperature source Temporal, resp. rate 17, height 5' 9 (1.753 m), weight 89 kg, SpO2 97%.  General appearance: alert, cooperative, and appears stated age Head: Normocephalic, without obvious abnormality, atraumatic Neck: supple, symmetrical, trachea midline Extremities: Intact sensation and capillary refill all digits.  +epl/fpl/io.  No wounds. Unable to flex left index dip joint and slightly decreased sensation on ulnar side of pad of finger. Skin: Skin color, texture, turgor normal. No rashes or lesions Neurologic: Grossly normal Incision/Wound: none  Assessment/Plan Left index finger laceration with flexor tendon laceration.  Plan exploration of wound with repair vs reconstruction of flexor tendon.  Non operative and operative treatment options have been discussed with the patient and patient wishes to proceed with operative treatment. Risks, benefits and alternatives of surgery were discussed including risks of blood loss, infection, damage to nerves/vessels/tendons/ligament/bone, failure of surgery,  need for additional surgery, complication with wound healing, stiffness.  He voiced understanding of these risks and elected to proceed.    Javier Obrien 05/08/2024, 12:51 PM

## 2024-05-08 NOTE — Discharge Instructions (Addendum)
 Hand Center Instructions Hand Surgery  Wound Care: Keep your hand elevated above the level of your heart.  Do not allow it to dangle by your side.  Keep the dressing dry and do not remove it unless your doctor advises you to do so.  He will usually change it at the time of your post-op visit.  Moving your fingers is advised to stimulate circulation but will depend on the site of your surgery.  If you have a splint applied, your doctor will advise you regarding movement.  Activity: Do not drive or operate machinery today.  Rest today and then you may return to your normal activity and work as indicated by your physician.  Diet:  Drink liquids today or eat a light diet.  You may resume a regular diet tomorrow.    General expectations: Pain for two to three days. Fingers may become slightly swollen.  Call your doctor if any of the following occur: Severe pain not relieved by pain medication. Elevated temperature. Dressing soaked with blood. Inability to move fingers. White or bluish color to fingers.   No Tylenol before 5:30pm today.   Post Anesthesia Home Care Instructions  Activity: Get plenty of rest for the remainder of the day. A responsible individual must stay with you for 24 hours following the procedure.  For the next 24 hours, DO NOT: -Drive a car -Advertising copywriter -Drink alcoholic beverages -Take any medication unless instructed by your physician -Make any legal decisions or sign important papers.  Meals: Start with liquid foods such as gelatin or soup. Progress to regular foods as tolerated. Avoid greasy, spicy, heavy foods. If nausea and/or vomiting occur, drink only clear liquids until the nausea and/or vomiting subsides. Call your physician if vomiting continues.  Special Instructions/Symptoms: Your throat may feel dry or sore from the anesthesia or the breathing tube placed in your throat during surgery. If this causes discomfort, gargle with warm salt water.  The discomfort should disappear within 24 hours.  If you had a scopolamine patch placed behind your ear for the management of post- operative nausea and/or vomiting:  1. The medication in the patch is effective for 72 hours, after which it should be removed.  Wrap patch in a tissue and discard in the trash. Wash hands thoroughly with soap and water. 2. You may remove the patch earlier than 72 hours if you experience unpleasant side effects which may include dry mouth, dizziness or visual disturbances. 3. Avoid touching the patch. Wash your hands with soap and water after contact with the patch.

## 2024-05-08 NOTE — Transfer of Care (Signed)
 Immediate Anesthesia Transfer of Care Note  Patient: Research officer, political party  Procedure(s) Performed: REPAIR, TENDON, FLEXOR (Left: Index Finger)  Patient Location: PACU  Anesthesia Type:General  Level of Consciousness: drowsy  Airway & Oxygen Therapy: Patient Spontanous Breathing and Patient connected to face mask oxygen  Post-op Assessment: Report given to RN and Post -op Vital signs reviewed and stable  Post vital signs: Reviewed and stable  Last Vitals:  Vitals Value Taken Time  BP 132/99 (109) 05/08/24 15:35  Temp    Pulse 76 05/08/24 15:36  Resp 16 05/08/24 15:36  SpO2 98 % 05/08/24 15:36  Vitals shown include unfiled device data.  Last Pain:  Vitals:   05/08/24 1125  TempSrc: Temporal  PainSc: 0-No pain      Patients Stated Pain Goal: 4 (05/08/24 1125)  Complications: No notable events documented.

## 2024-05-08 NOTE — Anesthesia Procedure Notes (Signed)
 Procedure Name: LMA Insertion Date/Time: 05/08/2024 2:07 PM  Performed by: Edith Elsie Lloyd, CRNAPre-anesthesia Checklist: Patient identified, Emergency Drugs available, Suction available and Patient being monitored Patient Re-evaluated:Patient Re-evaluated prior to induction Oxygen Delivery Method: Circle system utilized Preoxygenation: Pre-oxygenation with 100% oxygen Induction Type: IV induction LMA: LMA inserted LMA Size: 4.0 Number of attempts: 1 Placement Confirmation: positive ETCO2 Tube secured with: Tape Dental Injury: Teeth and Oropharynx as per pre-operative assessment

## 2024-05-08 NOTE — Anesthesia Preprocedure Evaluation (Addendum)
 Anesthesia Evaluation  Patient identified by MRN, date of birth, ID band Patient awake    Reviewed: Allergy & Precautions, NPO status , Patient's Chart, lab work & pertinent test results  Airway Mallampati: II  TM Distance: >3 FB Neck ROM: Full    Dental  (+) Missing   Pulmonary Patient abstained from smoking., former smoker   Pulmonary exam normal        Cardiovascular hypertension, Pt. on medications Normal cardiovascular exam     Neuro/Psych negative neurological ROS     GI/Hepatic negative GI ROS, Neg liver ROS,,,  Endo/Other  negative endocrine ROS    Renal/GU negative Renal ROS     Musculoskeletal   Abdominal   Peds  Hematology negative hematology ROS (+)   Anesthesia Other Findings LEFT INDEX FINGER LACERATION INVOLVING TENDON  Reproductive/Obstetrics                              Anesthesia Physical Anesthesia Plan  ASA: 2  Anesthesia Plan: General   Post-op Pain Management:    Induction: Intravenous  PONV Risk Score and Plan: 2 and Ondansetron, Dexamethasone, Midazolam and Treatment may vary due to age or medical condition  Airway Management Planned: LMA  Additional Equipment:   Intra-op Plan:   Post-operative Plan: Extubation in OR  Informed Consent: I have reviewed the patients History and Physical, chart, labs and discussed the procedure including the risks, benefits and alternatives for the proposed anesthesia with the patient or authorized representative who has indicated his/her understanding and acceptance.     Dental advisory given  Plan Discussed with: CRNA  Anesthesia Plan Comments:         Anesthesia Quick Evaluation

## 2024-05-09 ENCOUNTER — Encounter (HOSPITAL_BASED_OUTPATIENT_CLINIC_OR_DEPARTMENT_OTHER): Payer: Self-pay | Admitting: Orthopedic Surgery

## 2024-05-09 NOTE — Anesthesia Postprocedure Evaluation (Signed)
 Anesthesia Post Note  Patient: Javier Obrien, Javier Obrien  Procedure(s) Performed: REPAIR, TENDON, FLEXOR (Left: Index Finger)     Patient location during evaluation: PACU Anesthesia Type: General Level of consciousness: awake Pain management: pain level controlled Vital Signs Assessment: post-procedure vital signs reviewed and stable Respiratory status: spontaneous breathing, nonlabored ventilation and respiratory function stable Cardiovascular status: blood pressure returned to baseline and stable Postop Assessment: no apparent nausea or vomiting Anesthetic complications: no   No notable events documented.  Last Vitals:  Vitals:   05/08/24 1545 05/08/24 1559  BP: (!) 115/97 136/89  Pulse: 71 67  Resp: 14 16  Temp:  (!) 36.2 C  SpO2: 97% 94%    Last Pain:  Vitals:   05/08/24 1559  TempSrc:   PainSc: 0-No pain                 Micaylah Bertucci P Kriss Ishler

## 2024-06-22 ENCOUNTER — Ambulatory Visit: Payer: Self-pay | Admitting: Family Medicine

## 2024-06-22 ENCOUNTER — Encounter: Payer: Self-pay | Admitting: Family Medicine

## 2024-06-22 VITALS — BP 122/70 | HR 53 | Temp 98.1°F | Ht 69.0 in | Wt 203.0 lb

## 2024-06-22 DIAGNOSIS — Z114 Encounter for screening for human immunodeficiency virus [HIV]: Secondary | ICD-10-CM

## 2024-06-22 DIAGNOSIS — E782 Mixed hyperlipidemia: Secondary | ICD-10-CM

## 2024-06-22 DIAGNOSIS — Z7689 Persons encountering health services in other specified circumstances: Secondary | ICD-10-CM

## 2024-06-22 DIAGNOSIS — I10 Essential (primary) hypertension: Secondary | ICD-10-CM

## 2024-06-22 DIAGNOSIS — Z1159 Encounter for screening for other viral diseases: Secondary | ICD-10-CM

## 2024-06-22 DIAGNOSIS — Z1211 Encounter for screening for malignant neoplasm of colon: Secondary | ICD-10-CM

## 2024-06-22 LAB — POCT URINALYSIS DIP (CLINITEK)
Bilirubin, UA: NEGATIVE
Blood, UA: NEGATIVE
Glucose, UA: NEGATIVE mg/dL
Ketones, POC UA: NEGATIVE mg/dL
Leukocytes, UA: NEGATIVE
Nitrite, UA: NEGATIVE
POC PROTEIN,UA: NEGATIVE
Spec Grav, UA: 1.015 (ref 1.010–1.025)
Urobilinogen, UA: 0.2 U/dL
pH, UA: 6.5 (ref 5.0–8.0)

## 2024-06-22 NOTE — Progress Notes (Signed)
 I,Jameka J Llittleton, CMA,acting as a neurosurgeon for [employer], NP.,have documented all relevant documentation on the behalf of Bruna Creighton, NP,as directed by  Bruna Creighton, NP while in the presence of Bruna Creighton, NP.  Subjective:  Patient ID: Javier Obrien , male    DOB: [birth year] , 63 y.o.   MRN: [id number]  Chief Complaint  Patient presents with   Establish Care    Patient presents today to establish care. Patient reports he last seen a pcp about 6 months ago.    Hypertension    Patient presents today for a bpc. Patient reports compliance with his meds. Patient denies having chest pain, sob or headaches at this time.     HPI Discussed the use of AI scribe software for clinical note transcription with the patient, who gave verbal consent to proceed.  History of Present Illness     Javier Obrien is a 63 year old male with hypertension who presents to establish care and for blood pressure management.  He has a history of hypertension and is currently taking Norvasc , hydrochlorothiazide , and olmesartan , which he started approximately eight months ago. He occasionally misses doses but generally monitors his blood pressure at home, reporting it as well-controlled. Recently, he experienced a significant elevation in blood pressure, recorded at 194/111 mmHg, during a visit to the emergency room.  He has been exploring alternative remedies such as soursop and black seed oil, which he believes help in managing his blood pressure. He orders these supplements online and acknowledges their bitter taste.  He sustained an injury to his left hand, resulting in a cut to the forefinger that went to the bone and caused a hairline fracture and tendon damage. He underwent surgery to repair the tendon and is currently in the healing phase.  He has a history of smoking but quit years ago. He occasionally smokes now, 'every blue moon'.  He has not been consistent with his primary care visits due to changes in  his primary care provider, which has led to confusion about who his current doctor is. He has not received lab results from a previous physical conducted at [facility].  He is due for a colonoscopy, as it has been nearly ten years since his last one, which was arranged through the [facility]. He is open to being referred for this procedure.  No chest pain.      Past Medical History:  Diagnosis Date   Arthritis    left shoulder   Hypertension      Family History  Problem Relation Age of Onset   Depression Mother    Diabetes Father    Hypertension Father    COPD Father    Stroke Maternal Grandmother    Cancer Maternal Grandfather    Cancer Paternal Grandmother    Asthma Paternal Grandfather      Current Outpatient Medications:    amLODipine  (NORVASC ) 5 MG tablet, Take 1 tablet (5 mg total) by mouth at bedtime., Disp: 30 tablet, Rfl: 0   hydrochlorothiazide  (HYDRODIURIL ) 12.5 MG tablet, Take 1 tablet (12.5 mg total) by mouth daily., Disp: 30 tablet, Rfl: 0   olmesartan  (BENICAR ) 40 MG tablet, Take 1 tablet (40 mg total) by mouth daily., Disp: 30 tablet, Rfl: 0   HYDROcodone -acetaminophen  (NORCO/VICODIN) 5-325 MG tablet, Take 1 tablet by mouth every 6 (six) hours as needed for moderate pain (pain score 4-6). (Patient not taking: Reported on 06/22/2024), Disp: 20 tablet, Rfl: 0   No Known Allergies   Review  of Systems  Constitutional: Negative.   HENT: Negative.    Cardiovascular: Negative.   Musculoskeletal: Negative.   Skin: Negative.   Neurological: Negative.   Psychiatric/Behavioral: Negative.       Today's Vitals   06/22/24 0908  BP: 122/70  Pulse: (!) 53  Temp: 98.1 F (36.7 C)  TempSrc: Oral  Weight: 203 lb (92.1 kg)  Height: 5' 9 (1.753 m)  PainSc: 0-No pain   Body mass index is 29.98 kg/m.  Wt Readings from Last 3 Encounters:  06/22/24 203 lb (92.1 kg)  05/08/24 196 lb 3.4 oz (89 kg)    The 10-year ASCVD risk score (Arnett DK, et al., 2019) is: 12.1%    Values used to calculate the score:     Age: 40 years     Clinically relevant sex: Male     Is Non-Hispanic African American: Yes     Diabetic: No     Tobacco smoker: No     Systolic Blood Pressure: 122 mmHg     Is BP treated: Yes     HDL Cholesterol: 71 mg/dL     Total Cholesterol: 161 mg/dL  Objective:  Physical Exam Constitutional:      Appearance: Normal appearance.  HENT:     Head: Normocephalic.  Cardiovascular:     Rate and Rhythm: Normal rate and regular rhythm.     Pulses: Normal pulses.     Heart sounds: Normal heart sounds.  Pulmonary:     Effort: Pulmonary effort is normal.     Breath sounds: Normal breath sounds.  Abdominal:     General: Bowel sounds are normal.  Neurological:     Mental Status: He is alert.         Assessment And Plan:   Assessment & Plan Encounter to establish care with new doctor  Essential hypertension Well controlled, continue current medication. Non-compliance led to elevated blood pressure. Discussed risks of non-compliance, including stroke. Advised to continue prescribed medications despite exploring alternative therapies. Encounter for screening for HIV Check lab Encounter for hepatitis C screening test for low risk patient Check lab Mixed hyperlipidemia - Ordered blood work to check cholesterol levels. Screening for colon cancer Refer to [facility]  Assessment and Plan Assessment & Plan Essential hypertension Hypertension managed with Nuvessa, hydrochlorothiazide , and olmesartan . - Continue Nuvessa, hydrochlorothiazide , and olmesartan . - Encouraged adherence to medication regimen. - Ordered blood work for kidney function and cholesterol levels. - Scheduled blood pressure checks every six months.  Mixed hyperlipidemia Blood work planned to assess cholesterol levels.  General Health Maintenance Due for a colonoscopy. Discussed the importance of regular screenings. - Ordered colonoscopy.     Return in about 4 months  (around 10/21/2024) for bpc, physical.  Patient was given opportunity to ask questions. Patient verbalized understanding of the plan and was able to repeat key elements of the plan. All questions were answered to their satisfaction.    I, Bruna Creighton, NP, have reviewed all documentation for this visit. The documentation on 07/02/2024 for the exam, diagnosis, procedures, and orders are all accurate and complete.   IF YOU HAVE BEEN REFERRED TO A SPECIALIST, IT MAY TAKE 1-2 WEEKS TO SCHEDULE/PROCESS THE REFERRAL. IF YOU HAVE NOT HEARD FROM US /SPECIALIST IN TWO WEEKS, PLEASE GIVE US  A CALL AT [phone].

## 2024-06-22 NOTE — Patient Instructions (Signed)
 Hypertension, Adult Hypertension is another name for high blood pressure. High blood pressure forces your heart to work harder to pump blood. This can cause problems over time. There are two numbers in a blood pressure reading. There is a top number (systolic) over a bottom number (diastolic). It is best to have a blood pressure that is below 120/80. What are the causes? The cause of this condition is not known. Some other conditions can lead to high blood pressure. What increases the risk? Some lifestyle factors can make you more likely to develop high blood pressure: Smoking. Not getting enough exercise or physical activity. Being overweight. Having too much fat, sugar, calories, or salt (sodium) in your diet. Drinking too much alcohol. Other risk factors include: Having any of these conditions: Heart disease. Diabetes. High cholesterol. Kidney disease. Obstructive sleep apnea. Having a family history of high blood pressure and high cholesterol. Age. The risk increases with age. Stress. What are the signs or symptoms? High blood pressure may not cause symptoms. Very high blood pressure (hypertensive crisis) may cause: Headache. Fast or uneven heartbeats (palpitations). Shortness of breath. Nosebleed. Vomiting or feeling like you may vomit (nauseous). Changes in how you see. Very bad chest pain. Feeling dizzy. Seizures. How is this treated? This condition is treated by making healthy lifestyle changes, such as: Eating healthy foods. Exercising more. Drinking less alcohol. Your doctor may prescribe medicine if lifestyle changes do not help enough and if: Your top number is above 130. Your bottom number is above 80. Your personal target blood pressure may vary. Follow these instructions at home: Eating and drinking  If told, follow the DASH eating plan. To follow this plan: Fill one half of your plate at each meal with fruits and vegetables. Fill one fourth of your plate  at each meal with whole grains. Whole grains include whole-wheat pasta, brown rice, and whole-grain bread. Eat or drink low-fat dairy products, such as skim milk or low-fat yogurt. Fill one fourth of your plate at each meal with low-fat (lean) proteins. Low-fat proteins include fish, chicken without skin, eggs, beans, and tofu. Avoid fatty meat, cured and processed meat, or chicken with skin. Avoid pre-made or processed food. Limit the amount of salt in your diet to less than 1,500 mg each day. Do not drink alcohol if: Your doctor tells you not to drink. You are pregnant, may be pregnant, or are planning to become pregnant. If you drink alcohol: Limit how much you have to: 0-1 drink a day for women. 0-2 drinks a day for men. Know how much alcohol is in your drink. In the [place] one drink equals one 12 oz bottle of beer (355 mL), one 5 oz glass of wine (148 mL), or one 1 oz glass of hard liquor (44 mL). Lifestyle  Work with your doctor to stay at a healthy weight or to lose weight. Ask your doctor what the best weight is for you. Get at least 30 minutes of exercise that causes your heart to beat faster (aerobic exercise) most days of the week. This may include walking, swimming, or biking. Get at least 30 minutes of exercise that strengthens your muscles (resistance exercise) at least 3 days a week. This may include lifting weights or doing Pilates. Do not smoke or use any products that contain nicotine or tobacco. If you need help quitting, ask your doctor. Check your blood pressure at home as told by your doctor. Keep all follow-up visits. Medicines Take over-the-counter and prescription medicines  only as told by your doctor. Follow directions carefully. Do not skip doses of blood pressure medicine. The medicine does not work as well if you skip doses. Skipping doses also puts you at risk for problems. Ask your doctor about side effects or reactions to medicines that you should watch  for. Contact a doctor if: You think you are having a reaction to the medicine you are taking. You have headaches that keep coming back. You feel dizzy. You have swelling in your ankles. You have trouble with your vision. Get help right away if: You get a very bad headache. You start to feel mixed up (confused). You feel weak or numb. You feel faint. You have very bad pain in your: Chest. Belly (abdomen). You vomit more than once. You have trouble breathing. These symptoms may be an emergency. Get help right away. Call 911. Do not wait to see if the symptoms will go away. Do not drive yourself to the hospital. Summary Hypertension is another name for high blood pressure. High blood pressure forces your heart to work harder to pump blood. For most people, a normal blood pressure is less than 120/80. Making healthy choices can help lower blood pressure. If your blood pressure does not get lower with healthy choices, you may need to take medicine. This information is not intended to replace advice given to you by your health care provider. Make sure you discuss any questions you have with your health care provider. Document Revised: 04/23/2021 Document Reviewed: 04/23/2021 Elsevier Patient Education  2024 [employer].

## 2024-06-23 LAB — CBC
Hematocrit: 43.9 % (ref 37.5–51.0)
Hemoglobin: 14.5 g/dL (ref 13.0–17.7)
MCH: 30.3 pg (ref 26.6–33.0)
MCHC: 33 g/dL (ref 31.5–35.7)
MCV: 92 fL (ref 79–97)
Platelets: 258 x10E3/uL (ref 150–450)
RBC: 4.78 x10E6/uL (ref 4.14–5.80)
RDW: 13.5 % (ref 11.6–15.4)
WBC: 5.1 x10E3/uL (ref 3.4–10.8)

## 2024-06-23 LAB — CMP14+EGFR
ALT: 14 IU/L (ref 0–44)
AST: 15 IU/L (ref 0–40)
Albumin: 4.1 g/dL (ref 3.9–4.9)
Alkaline Phosphatase: 59 IU/L (ref 47–123)
BUN/Creatinine Ratio: 10 (ref 10–24)
BUN: 10 mg/dL (ref 8–27)
Bilirubin Total: <0.2 mg/dL (ref 0.0–1.2)
CO2: 27 mmol/L (ref 20–29)
Calcium: 9.4 mg/dL (ref 8.6–10.2)
Chloride: 103 mmol/L (ref 96–106)
Creatinine, Ser: 0.96 mg/dL (ref 0.76–1.27)
Globulin, Total: 2.6 g/dL (ref 1.5–4.5)
Glucose: 94 mg/dL (ref 70–99)
Potassium: 4.8 mmol/L (ref 3.5–5.2)
Sodium: 141 mmol/L (ref 134–144)
Total Protein: 6.7 g/dL (ref 6.0–8.5)
eGFR: 88 mL/min/1.73 (ref >59)

## 2024-06-23 LAB — LIPID PANEL
Chol/HDL Ratio: 2.3 ratio (ref 0.0–5.0)
Cholesterol, Total: 161 mg/dL (ref 100–199)
HDL: 71 mg/dL (ref >39)
LDL Chol Calc (NIH): 79 mg/dL (ref 0–99)
Triglycerides: 56 mg/dL (ref 0–149)
VLDL Cholesterol Cal: 11 mg/dL (ref 5–40)

## 2024-06-23 LAB — HIV ANTIBODY (ROUTINE TESTING W REFLEX): HIV Screen 4th Generation wRfx: NONREACTIVE

## 2024-06-23 LAB — MICROALBUMIN / CREATININE URINE RATIO
Creatinine, Urine: 48.3 mg/dL
Microalb/Creat Ratio: <6 mg/g{creat} (ref 0–29)
Microalbumin, Urine: <3 ug/mL

## 2024-06-23 LAB — HEPATITIS C ANTIBODY: Hep C Virus Ab: NONREACTIVE

## 2024-07-02 ENCOUNTER — Ambulatory Visit: Payer: Self-pay | Admitting: Family Medicine

## 2024-07-02 DIAGNOSIS — Z114 Encounter for screening for human immunodeficiency virus [HIV]: Secondary | ICD-10-CM | POA: Insufficient documentation

## 2024-07-02 DIAGNOSIS — Z1211 Encounter for screening for malignant neoplasm of colon: Secondary | ICD-10-CM | POA: Insufficient documentation

## 2024-07-02 DIAGNOSIS — I10 Essential (primary) hypertension: Secondary | ICD-10-CM | POA: Insufficient documentation

## 2024-07-02 DIAGNOSIS — Z1159 Encounter for screening for other viral diseases: Secondary | ICD-10-CM | POA: Insufficient documentation

## 2024-07-02 DIAGNOSIS — Z7689 Persons encountering health services in other specified circumstances: Secondary | ICD-10-CM | POA: Insufficient documentation

## 2024-07-02 DIAGNOSIS — E782 Mixed hyperlipidemia: Secondary | ICD-10-CM | POA: Insufficient documentation

## 2024-07-02 NOTE — Assessment & Plan Note (Signed)
 Check lab

## 2024-07-02 NOTE — Assessment & Plan Note (Signed)
 Well controlled, continue current medication. Non-compliance led to elevated blood pressure. Discussed risks of non-compliance, including stroke. Advised to continue prescribed medications despite exploring alternative therapies.

## 2024-07-02 NOTE — Therapy (Incomplete)
 OUTPATIENT PHYSICAL THERAPY SHOULDER EVALUATION   Patient Name: Javier Obrien MRN: [id number] DOB:[birth year], 63 y.o., male Today's Date: 07/02/2024  END OF SESSION:   Past Medical History:  Diagnosis Date   Arthritis    left shoulder   Hypertension    Past Surgical History:  Procedure Laterality Date   FLEXOR TENDON REPAIR Left 05/08/2024   Procedure: REPAIR, TENDON, FLEXOR;  Surgeon: Murrell Drivers, MD;  Location: [facility];  Service: Orthopedics;  Laterality: Left;   HERNIA REPAIR     KNEE ARTHROSCOPY WITH MENISCAL REPAIR     Right neck/wrist/hand     Patient Active Problem List   Diagnosis Date Noted   Encounter to establish care with new doctor 07/02/2024   Essential hypertension 07/02/2024   Encounter for screening for HIV 07/02/2024   Encounter for hepatitis C screening test for low risk patient 07/02/2024   Mixed hyperlipidemia 07/02/2024   Screening for colon cancer 07/02/2024    PCP: Petrina Pries, NP   REFERRING PROVIDER: Kimberly Sharper, MD   REFERRING DIAG: M25.512 (ICD-10-CM) - Pain in left shoulder   THERAPY DIAG:  No diagnosis found.  Rationale for Evaluation and Treatment: Rehabilitation  ONSET DATE: ***  SUBJECTIVE:                                                                                                                                                                                      SUBJECTIVE STATEMENT: *** Hand dominance: {MISC; OT HAND DOMINANCE:[id number]}  PERTINENT HISTORY: ***  PAIN:  Are you having pain? Yes: NPRS scale: *** Pain location: *** Pain description: *** Aggravating factors: *** Relieving factors: ***  PRECAUTIONS: {Therapy precautions:24002}  RED FLAGS: {PT Red Flags:29287}   WEIGHT BEARING RESTRICTIONS: {Yes ***/No:24003}  FALLS:  Has patient fallen in last 6 months? {fallsyesno:27318}  LIVING ENVIRONMENT: Lives with: {[facility] lives with:25569::lives with their family} Lives in:  {Lives in:25570} Stairs: {opstairs:27293} Has following equipment at home: {Assistive devices:23999}  OCCUPATION: ***  PLOF: {PLOF:24004}  PATIENT GOALS:***  NEXT MD VISIT:   OBJECTIVE:  Note: Objective measures were completed at Evaluation unless otherwise noted.  DIAGNOSTIC FINDINGS:  ***  PATIENT SURVEYS:  {rehab surveys:24030:a}  COGNITION: Overall cognitive status: {cognition:24006}     SENSATION: {sensation:27233}  POSTURE: ***  UPPER EXTREMITY ROM:   {AROM/PROM:27142} ROM Right eval Left eval  Shoulder flexion    Shoulder extension    Shoulder abduction    Shoulder adduction    Shoulder internal rotation    Shoulder external rotation    Elbow flexion    Elbow extension    Wrist flexion    Wrist extension    Wrist ulnar deviation  Wrist radial deviation    Wrist pronation    Wrist supination    (Blank rows = not tested)  UPPER EXTREMITY MMT:  MMT Right eval Left eval  Shoulder flexion    Shoulder extension    Shoulder abduction    Shoulder adduction    Shoulder internal rotation    Shoulder external rotation    Middle trapezius    Lower trapezius    Elbow flexion    Elbow extension    Wrist flexion    Wrist extension    Wrist ulnar deviation    Wrist radial deviation    Wrist pronation    Wrist supination    Grip strength (lbs)    (Blank rows = not tested)  SHOULDER SPECIAL TESTS: Impingement tests: {shoulder impingement test:25231:a} SLAP lesions: {SLAP lesions:25232} Instability tests: {shoulder instability test:25233} Rotator cuff assessment: {rotator cuff assessment:25234} Biceps assessment: {biceps assessment:25235}  JOINT MOBILITY TESTING:  ***  PALPATION:  ***                                                                                                                             TREATMENT DATE:  [facility] Adult PT Treatment:                                                DATE: 07/03/24 Therapeutic  Exercise: *** Manual Therapy: *** Neuromuscular re-ed: *** Therapeutic Activity: *** Modalities: *** Self Care: ***    PATIENT EDUCATION: Education details: *** Person educated: {Person educated:25204} Education method: {Education Method:25205} Education comprehension: {Education Comprehension:25206}  HOME EXERCISE PROGRAM: ***  ASSESSMENT:  CLINICAL IMPRESSION: Patient is a  y.o. 16 male who was seen today for physical therapy evaluation and treatment for M25.512 (ICD-10-CM) - Pain in left shoulder.      OBJECTIVE IMPAIRMENTS: {opptimpairments:25111}.   ACTIVITY LIMITATIONS: {activitylimitations:27494}  PARTICIPATION LIMITATIONS: {participationrestrictions:25113}  PERSONAL FACTORS: {Personal factors:25162} are also affecting patient's functional outcome.   REHAB POTENTIAL: {rehabpotential:25112}  CLINICAL DECISION MAKING: {clinical decision making:25114}  EVALUATION COMPLEXITY: {Evaluation complexity:25115}   GOALS:  SHORT TERM GOALS: Target date: 07/20/24  Pt will be Ind in an initial HEP  Baseline: started Goal status: INITIAL  2.  *** Baseline:  Goal status: INITIAL  3.  *** Baseline:  Goal status: INITIAL  4.  *** Baseline:  Goal status: INITIAL  5.  *** Baseline:  Goal status: INITIAL  6.  *** Baseline:  Goal status: INITIAL  LONG TERM GOALS: Target date: ***  Pt will be Ind in a final HEP to maintain achieved LOF  Baseline: started Goal status: INITIAL  2.  *** Baseline:  Goal status: INITIAL  3.  *** Baseline:  Goal status: INITIAL  4.  *** Baseline:  Goal status: INITIAL  5.  *** Baseline:  Goal status: INITIAL  6.  *** Baseline:  Goal status: INITIAL  PLAN:  PT FREQUENCY: {rehab frequency:25116}  PT DURATION: {rehab duration:25117}  PLANNED INTERVENTIONS: {rehab planned interventions:25118::97110-Therapeutic exercises,97530- Therapeutic [id number]- Neuromuscular re-education,97535- Self  Rjmz,02859- Manual therapy,Patient/Family education}  PLAN FOR NEXT SESSION: ***   Dasie Daft, PT 07/02/2024, 8:18 PM

## 2024-07-02 NOTE — Progress Notes (Signed)
All labs are normal. Continue current medications

## 2024-07-02 NOTE — Assessment & Plan Note (Signed)
-   Ordered blood work to check cholesterol levels.

## 2024-07-02 NOTE — Assessment & Plan Note (Signed)
 Refer to GI

## 2024-07-03 ENCOUNTER — Ambulatory Visit

## 2024-08-20 ENCOUNTER — Encounter: Payer: Self-pay | Admitting: Gastroenterology

## 2024-10-24 ENCOUNTER — Encounter: Admitting: Family Medicine
